# Patient Record
Sex: Male | Born: 1960 | Race: Black or African American | Hispanic: No | Marital: Single | Smoking: Current every day smoker
Health system: Southern US, Community
[De-identification: ages and names within clinical notes are randomized; demographics above are authoritative.]

## PROBLEM LIST (undated history)

## (undated) DIAGNOSIS — J45909 Unspecified asthma, uncomplicated: Secondary | ICD-10-CM

## (undated) HISTORY — PX: NO PAST SURGERIES: SHX2092

---

## 2010-04-03 ENCOUNTER — Emergency Department (HOSPITAL_COMMUNITY)
Admission: EM | Admit: 2010-04-03 | Discharge: 2010-04-04 | Payer: Self-pay | Source: Home / Self Care | Admitting: Emergency Medicine

## 2010-06-19 LAB — URINALYSIS, ROUTINE W REFLEX MICROSCOPIC
Bilirubin Urine: NEGATIVE
Ketones, ur: NEGATIVE mg/dL
Protein, ur: 30 mg/dL — AB
Specific Gravity, Urine: 1.022 (ref 1.005–1.030)

## 2010-06-19 LAB — URINE MICROSCOPIC-ADD ON

## 2013-07-15 ENCOUNTER — Emergency Department (HOSPITAL_COMMUNITY)
Admission: EM | Admit: 2013-07-15 | Discharge: 2013-07-15 | Disposition: A | Discharge status: Home / Self Care | Payer: Self-pay | Attending: Emergency Medicine | Admitting: Emergency Medicine

## 2013-07-15 ENCOUNTER — Encounter (HOSPITAL_COMMUNITY): Payer: Self-pay | Admitting: Emergency Medicine

## 2013-07-15 ENCOUNTER — Emergency Department (HOSPITAL_COMMUNITY): Payer: Self-pay

## 2013-07-15 DIAGNOSIS — J209 Acute bronchitis, unspecified: Secondary | ICD-10-CM

## 2013-07-15 DIAGNOSIS — F17200 Nicotine dependence, unspecified, uncomplicated: Secondary | ICD-10-CM | POA: Insufficient documentation

## 2013-07-15 DIAGNOSIS — M549 Dorsalgia, unspecified: Secondary | ICD-10-CM | POA: Insufficient documentation

## 2013-07-15 DIAGNOSIS — J45901 Unspecified asthma with (acute) exacerbation: Secondary | ICD-10-CM | POA: Insufficient documentation

## 2013-07-15 HISTORY — DX: Unspecified asthma, uncomplicated: J45.909

## 2013-07-15 MED ORDER — PREDNISONE 10 MG PO TABS: 20.0000 mg | ORAL_TABLET | Freq: Two times a day (BID) | ORAL | Status: DC

## 2013-07-15 MED ORDER — ALBUTEROL SULFATE HFA 108 (90 BASE) MCG/ACT IN AERS
2.0000 | INHALATION_SPRAY | RESPIRATORY_TRACT | Status: DC | PRN
  Administered 2013-07-15: 2 via RESPIRATORY_TRACT
  Filled 2013-07-15: qty 6.7

## 2013-07-15 MED ORDER — IPRATROPIUM BROMIDE 0.02 % IN SOLN
0.5000 mg | Freq: Once | RESPIRATORY_TRACT | Status: AC
  Administered 2013-07-15: 0.5 mg via RESPIRATORY_TRACT
  Filled 2013-07-15: qty 2.5

## 2013-07-15 MED ORDER — ALBUTEROL SULFATE (2.5 MG/3ML) 0.083% IN NEBU
5.0000 mg | INHALATION_SOLUTION | Freq: Once | RESPIRATORY_TRACT | Status: AC
  Administered 2013-07-15: 5 mg via RESPIRATORY_TRACT
  Filled 2013-07-15: qty 6

## 2013-07-15 MED ORDER — GUAIFENESIN-CODEINE 100-10 MG/5ML PO SOLN
10.0000 mL | Freq: Three times a day (TID) | ORAL | Status: DC | PRN
Start: 2013-07-15 — End: 2016-06-11

## 2013-07-15 NOTE — ED Provider Notes (Signed)
CSN: [id number]     Arrival date & time 07/15/13  1043 History  This chart was scribed for Veryl Speak, MD by Zettie Pho, ED Scribe. This patient was seen in room APA12/APA12 and the patient's care was started at 11:19 AM.    Chief Complaint  Patient presents with  . Cough  . Fever   The history is provided by the patient. No language interpreter was used.   HPI Comments: Thomas Mcdonald is a 53 y.o. Male with a history of asthma who presents to the Emergency Department complaining of a dry cough with associated congestion, chills, and fever (patient is afebrile at 99 in the ED) onset 5 days ago. Patient reports some associated wheezing at night and back pain secondary to the cough. He states that he has not had an asthma attack in several years and does not currently have an inhaler. Patient reports that he has been exposed to sick contacts with similar symptoms. He denies ear pain. Patient has no other pertinent medical history. Patient is a current, every day smoker.   Past Medical History  Diagnosis Date  . Asthma    History reviewed. No pertinent past surgical history. No family history on file. History  Substance Use Topics  . Smoking status: Current Every Day Smoker    Types: Cigarettes  . Smokeless tobacco: Not on file  . Alcohol Use: Yes     Comment: Occ    Review of Systems  A complete 10 system review of systems was obtained and all systems are negative except as noted in the HPI and PMH.    Allergies  Review of patient's allergies indicates no known allergies.  Home Medications   Current Outpatient Rx  Name  Route  Sig  Dispense  Refill  . acetaminophen (TYLENOL) 500 MG tablet   Oral   Take 1,000 mg by mouth every 6 (six) hours as needed for fever.         Marland Kitchen ibuprofen (ADVIL,MOTRIN) 200 MG tablet   Oral   Take 200 mg by mouth every 6 (six) hours as needed for fever.         . Multiple Vitamin (MULTIVITAMIN WITH MINERALS) TABS tablet   Oral   Take 1  tablet by mouth daily.          Triage Vitals: BP 124/67  Pulse 87  Temp(Src) 99 F (37.2 C) (Oral)  Resp 20  Ht 5\' 9"  (1.753 m)  Wt 170 lb (77.111 kg)  BMI 25.09 kg/m2  SpO2 97%  Physical Exam  Nursing note and vitals reviewed. Constitutional: He is oriented to person, place, and time. He appears well-developed and well-nourished. No distress.  HENT:  Head: Normocephalic and atraumatic.  Right Ear: Hearing, tympanic membrane, external ear and ear canal normal.  Left Ear: Hearing, tympanic membrane, external ear and ear canal normal.  Mouth/Throat: Oropharynx is clear and moist. No oropharyngeal exudate.  No frontal or maxillary sinus tenderness to palpation.   Eyes: Conjunctivae are normal.  Neck: Normal range of motion. Neck supple.  Cardiovascular: Normal rate, regular rhythm and normal heart sounds.   Pulmonary/Chest: Effort normal and breath sounds normal. No respiratory distress.  Abdominal: He exhibits no distension.  Musculoskeletal: Normal range of motion.  Lymphadenopathy:    He has no cervical adenopathy.  Neurological: He is alert and oriented to person, place, and time.  Skin: Skin is warm and dry.  Psychiatric: He has a normal mood and affect. His behavior is normal.  ED Course  Procedures (including critical care time)  DIAGNOSTIC STUDIES: Oxygen Saturation is 97% on room air, normal by my interpretation.    COORDINATION OF CARE: 11:03 AM- Ordered a chest x-ray.   11:22 AM- Will order a breathing treatment to manage symptoms. Discussed treatment plan with patient at bedside and patient verbalized agreement.   12:19 PM- Discussed that x-ray results were negative and that symptoms are likely viral in nature. Patient reports feeling much better after receiving the breathing treatment. Will discharge patient with an inhaler, prednisone, and a cough suppressant to manage symptoms. Discussed treatment plan with patient at bedside and patient verbalized  agreement.    Labs Review Labs Reviewed - No data to display  Imaging Review Dg Chest 2 View  07/15/2013   CLINICAL DATA:  Cough and fever.  EXAM: CHEST  2 VIEW  COMPARISON:  None.  FINDINGS: Lungs clear. Heart size normal. No pneumothorax or pleural effusion. Remote lower left rib fracture and thoracic spondylosis noted.  IMPRESSION: No acute disease.   Electronically Signed   By: Inge Rise M.D.   On: 07/15/2013 11:34     EKG Interpretation None      MDM   Final diagnoses:  None    Patient presents with chest congestion and cough. He has a history of asthma and continues to smoke. Lung exam is unremarkable and chest x-ray does not reveal an infiltrate or other acute finding. His symptoms have been present for 5 days and I suspect are viral in nature. He will be prescribed prednisone, and albuterol inhaler, and cocci are to be used as needed.  I personally performed the services described in this documentation, which was scribed in my presence. The recorded information has been reviewed and is accurate.       Veryl Speak, MD 07/15/13 1228

## 2013-07-15 NOTE — ED Notes (Signed)
Pt states mostly nonproductive cough and fever which began Saturday. Denies pain at this time.

## 2013-07-15 NOTE — Discharge Instructions (Signed)
Prednisone as prescribed.  Albuterol inhaler: 2 puffs every 4 hours as needed for wheezing.  Robitussin with codeine as needed for cough.  Return to the ER if you develop severe chest pain, worsening breathing, or high fever.   Acute Bronchitis Bronchitis is inflammation of the airways that extend from the windpipe into the lungs (bronchi). The inflammation often causes mucus to develop. This leads to a cough, which is the most common symptom of bronchitis.  In acute bronchitis, the condition usually develops suddenly and goes away over time, usually in a couple weeks. Smoking, allergies, and asthma can make bronchitis worse. Repeated episodes of bronchitis may cause further lung problems.  CAUSES Acute bronchitis is most often caused by the same virus that causes a cold. The virus can spread from person to person (contagious).  SIGNS AND SYMPTOMS   Cough.   Fever.   Coughing up mucus.   Body aches.   Chest congestion.   Chills.   Shortness of breath.   Sore throat.  DIAGNOSIS  Acute bronchitis is usually diagnosed through a physical exam. Tests, such as chest X-rays, are sometimes done to rule out other conditions.  TREATMENT  Acute bronchitis usually goes away in a couple weeks. Often times, no medical treatment is necessary. Medicines are sometimes given for relief of fever or cough. Antibiotics are usually not needed but may be prescribed in certain situations. In some cases, an inhaler may be recommended to help reduce shortness of breath and control the cough. A cool mist vaporizer may also be used to help thin bronchial secretions and make it easier to clear the chest.  HOME CARE INSTRUCTIONS  Get plenty of rest.   Drink enough fluids to keep your urine clear or pale yellow (unless you have a medical condition that requires fluid restriction). Increasing fluids may help thin your secretions and will prevent dehydration.   Only take over-the-counter or  prescription medicines as directed by your health care provider.   Avoid smoking and secondhand smoke. Exposure to cigarette smoke or irritating chemicals will make bronchitis worse. If you are a smoker, consider using nicotine gum or skin patches to help control withdrawal symptoms. Quitting smoking will help your lungs heal faster.   Reduce the chances of another bout of acute bronchitis by washing your hands frequently, avoiding people with cold symptoms, and trying not to touch your hands to your mouth, nose, or eyes.   Follow up with your health care provider as directed.  SEEK MEDICAL CARE IF: Your symptoms do not improve after 1 week of treatment.  SEEK IMMEDIATE MEDICAL CARE IF:  You develop an increased fever or chills.   You have chest pain.   You have severe shortness of breath.  You have bloody sputum.   You develop dehydration.  You develop fainting.  You develop repeated vomiting.  You develop a severe headache. MAKE SURE YOU:   Understand these instructions.  Will watch your condition.  Will get help right away if you are not doing well or get worse. Document Released: 05/03/2004 Document Revised: 11/26/2012 Document Reviewed: 09/16/2012 [facility] Patient Information 2014 [place].

## 2016-06-11 ENCOUNTER — Encounter: Payer: Self-pay | Admitting: Family Medicine

## 2016-06-11 ENCOUNTER — Ambulatory Visit (INDEPENDENT_AMBULATORY_CARE_PROVIDER_SITE_OTHER): Payer: 59 | Admitting: Family Medicine

## 2016-06-11 VITALS — BP 102/64 | HR 80 | Temp 98.6°F | Resp 14 | Ht 69.0 in | Wt 177.0 lb

## 2016-06-11 DIAGNOSIS — Z125 Encounter for screening for malignant neoplasm of prostate: Secondary | ICD-10-CM

## 2016-06-11 DIAGNOSIS — R6882 Decreased libido: Secondary | ICD-10-CM | POA: Diagnosis not present

## 2016-06-11 DIAGNOSIS — Z23 Encounter for immunization: Secondary | ICD-10-CM

## 2016-06-11 DIAGNOSIS — Z1211 Encounter for screening for malignant neoplasm of colon: Secondary | ICD-10-CM | POA: Diagnosis not present

## 2016-06-11 DIAGNOSIS — Z1159 Encounter for screening for other viral diseases: Secondary | ICD-10-CM

## 2016-06-11 DIAGNOSIS — Z0000 Encounter for general adult medical examination without abnormal findings: Secondary | ICD-10-CM

## 2016-06-11 LAB — CBC WITH DIFFERENTIAL/PLATELET
BASOS PCT: 1 %
Basophils Absolute: 73 cells/uL (ref 0–200)
EOS ABS: 657 {cells}/uL — AB (ref 15–500)
Eosinophils Relative: 9 %
HEMATOCRIT: 43 % (ref 38.5–50.0)
Hemoglobin: 14.5 g/dL (ref 13.0–17.0)
Lymphocytes Relative: 26 %
Lymphs Abs: 1898 cells/uL (ref 850–3900)
MCH: 30.7 pg (ref 27.0–33.0)
MCHC: 33.7 g/dL (ref 32.0–36.0)
MCV: 91.1 fL (ref 80.0–100.0)
MONO ABS: 584 {cells}/uL (ref 200–950)
MONOS PCT: 8 %
MPV: 9.7 fL (ref 7.5–12.5)
NEUTROS ABS: 4088 {cells}/uL (ref 1500–7800)
Neutrophils Relative %: 56 %
PLATELETS: 267 10*3/uL (ref 140–400)
RBC: 4.72 MIL/uL (ref 4.20–5.80)
RDW: 13.8 % (ref 11.0–15.0)
WBC: 7.3 10*3/uL (ref 3.8–10.8)

## 2016-06-11 LAB — COMPREHENSIVE METABOLIC PANEL
ALK PHOS: 85 U/L (ref 40–115)
ALT: 19 U/L (ref 9–46)
AST: 19 U/L (ref 10–35)
Albumin: 4.1 g/dL (ref 3.6–5.1)
BILIRUBIN TOTAL: 0.4 mg/dL (ref 0.2–1.2)
BUN: 16 mg/dL (ref 7–25)
CO2: 29 mmol/L (ref 20–31)
Calcium: 9.2 mg/dL (ref 8.6–10.3)
Chloride: 103 mmol/L (ref 98–110)
Creat: 0.96 mg/dL (ref 0.70–1.33)
Glucose, Bld: 76 mg/dL (ref 70–99)
POTASSIUM: 4.5 mmol/L (ref 3.5–5.3)
Sodium: 140 mmol/L (ref 135–146)
TOTAL PROTEIN: 6.3 g/dL (ref 6.1–8.1)

## 2016-06-11 LAB — PSA: PSA: 1.2 ng/mL (ref <=4.0)

## 2016-06-11 LAB — LIPID PANEL
CHOL/HDL RATIO: 2.9 ratio (ref <5.0)
CHOLESTEROL: 185 mg/dL (ref <200)
HDL: 64 mg/dL (ref >40)
LDL Cholesterol: 105 mg/dL — ABNORMAL HIGH (ref <100)
Triglycerides: 82 mg/dL (ref <150)
VLDL: 16 mg/dL (ref <30)

## 2016-06-11 LAB — TSH: TSH: 2.16 m[IU]/L (ref 0.40–4.50)

## 2016-06-11 NOTE — Patient Instructions (Signed)
Release of records- [facility]  I recommend eye visit once a year I recommend dental visit every 6 months Goal is to  Exercise 30 minutes 5 days a week We will send a letter with lab results  Referral for colonoscopy  F/u pending results

## 2016-06-11 NOTE — Progress Notes (Signed)
   Subjective:    Patient ID: Thomas Mcdonald, male    DOB: 21-Apr-1960, 56 y.o.   MRN: [id number]  Patient presents for New Patient~ Establish Care (is fasting)  Pt here to establish care, previous PCP  Works for [employer] in [place], [place]  Asthma as a child has not needed anything in years Quit smoking 1.63months ago   Low libido, has used viagra in the past, no pre-ejaculation, difficulty getting and maintaining erections  At end of visit states he gets back pain on and off, for the past year or so, gets pain that radiates red labs     Review Of Systems:  GEN- denies fatigue, fever, weight loss,weakness, recent illness HEENT- denies eye drainage, change in vision, nasal discharge, CVS- denies chest pain, palpitations RESP- denies SOB, cough, wheeze ABD- denies N/V, change in stools, abd pain GU- denies dysuria, hematuria, dribbling, incontinence MSK- denies joint pain, muscle aches, injury Neuro- denies headache, dizziness, syncope, seizure activity       Objective:    BP 102/64   Pulse 80   Temp 98.6 F (37 C) (Oral)   Resp 14   Ht 5\' 9"  (1.753 m)   Wt 177 lb (80.3 kg)   SpO2 99%   BMI 26.14 kg/m  GEN- NAD, alert and oriented x3 HEENT- PERRL, EOMI, non injected sclera, pink conjunctiva, MMM, oropharynx clear Neck- Supple, no thyromegaly CVS- RRR, no murmur RESP-CTAB ABD-NABS,soft,NT,ND Rectum- normal tone, FOBT neg, prostate smooth, mild enlargement Psych- normal affect and mood  EXT- No edema Pulses- Radial, DP- 2+        Assessment & Plan:      Problem List Items Addressed This Visit    None    Visit Diagnoses    Routine general medical examination at a health care facility    -  Primary   CPE done, fasting labs, check Hep C, tesosterone PSA for low libido. given viagra samples 50mg  #4 . Referral for colonoscopy   Relevant Orders   CBC with Differential/Platelet (Completed)   Comprehensive metabolic panel (Completed)   Lipid panel (Completed)   TSH (Completed)   HIV antibody (Completed)   Prostate cancer screening       Relevant Orders   PSA (Completed)   Encounter for hepatitis C screening test for low risk patient       Relevant Orders   Hepatitis C antibody (Completed)   Colon cancer screening       Low libido       Relevant Orders   Testosterone (Completed)      Note: This dictation was prepared with Dragon dictation along with smaller phrase technology. Any transcriptional errors that result from this process are unintentional.

## 2016-06-12 LAB — HEPATITIS C ANTIBODY: HCV AB: NEGATIVE

## 2016-06-12 LAB — HIV ANTIBODY (ROUTINE TESTING W REFLEX): HIV: NONREACTIVE

## 2016-06-12 LAB — TESTOSTERONE: TESTOSTERONE: 479 ng/dL (ref 250–827)

## 2016-06-15 ENCOUNTER — Other Ambulatory Visit: Payer: Self-pay | Admitting: *Deleted

## 2016-06-15 DIAGNOSIS — R52 Pain, unspecified: Secondary | ICD-10-CM

## 2016-07-17 ENCOUNTER — Encounter: Payer: Self-pay | Admitting: Family Medicine

## 2016-09-13 ENCOUNTER — Encounter: Payer: Self-pay | Admitting: Family Medicine

## 2016-09-13 ENCOUNTER — Ambulatory Visit (INDEPENDENT_AMBULATORY_CARE_PROVIDER_SITE_OTHER): Payer: 59 | Admitting: Family Medicine

## 2016-09-13 VITALS — BP 110/74 | HR 60 | Temp 97.9°F | Resp 16 | Ht 69.0 in | Wt 182.0 lb

## 2016-09-13 DIAGNOSIS — W57XXXA Bitten or stung by nonvenomous insect and other nonvenomous arthropods, initial encounter: Principal | ICD-10-CM

## 2016-09-13 DIAGNOSIS — L03114 Cellulitis of left upper limb: Secondary | ICD-10-CM

## 2016-09-13 DIAGNOSIS — S40862A Insect bite (nonvenomous) of left upper arm, initial encounter: Secondary | ICD-10-CM | POA: Diagnosis not present

## 2016-09-13 MED ORDER — DOXYCYCLINE HYCLATE 100 MG PO TABS: 100.0000 mg | ORAL_TABLET | Freq: Two times a day (BID) | ORAL | 0 refills | Status: DC

## 2016-09-13 NOTE — Progress Notes (Signed)
   Subjective:    Patient ID: Thomas Mcdonald, male    DOB: 1960-07-30, 57 y.o.   MRN: [id number]  HPI  Patient felt a stinging pain on the dorsum of his left wrist yesterday. He was being bitten by an insect. He slapped the left wrist and saw the remains of some type of insect that was completely smashed and unrecognizable. Now all the dorsum of the left wrist is swollen substantially with warmth and erythema and swelling spreading up the left forearm and down into the left hand and into the left thumb. Differential diagnosis includes cellulitis versus allergic reaction. Past Medical History:  Diagnosis Date  . Asthma    No past surgical history on file. Current Outpatient Prescriptions on File Prior to Visit  Medication Sig Dispense Refill  . Multiple Vitamin (MULTIVITAMIN WITH MINERALS) TABS tablet Take 1 tablet by mouth daily.     No current facility-administered medications on file prior to visit.    No Known Allergies Social History   Social History  . Marital status: Single    Spouse name: N/A  . Number of children: N/A  . Years of education: N/A   Occupational History  . Not on file.   Social History Main Topics  . Smoking status: Former Smoker    Types: Cigarettes    Quit date: 05/14/2016  . Smokeless tobacco: Never Used  . Alcohol use Yes     Comment: Occ  . Drug use: No  . Sexual activity: Not Currently   Other Topics Concern  . Not on file   Social History Narrative  . No narrative on file     Review of Systems  All other systems reviewed and are negative.      Objective:   Physical Exam  Constitutional: He appears well-developed and well-nourished.  Cardiovascular: Normal rate, regular rhythm and normal heart sounds.   Pulmonary/Chest: Effort normal and breath sounds normal. No respiratory distress. He has no wheezes. He has no rales.  Musculoskeletal: He exhibits edema.       Arms: Skin: Skin is warm. There is erythema.            Assessment & Plan:  Insect bite of left arm, initial encounter  Cellulitis of left upper extremity  This could be an allergic reaction but I'm concerned about cellulitis. His tetanus shot was less than 2 years ago according to the patient. Begin doxycycline 100 mg by mouth twice a day for 7-10 days. Cover for possible allergic reaction with Benadryl 25 mg every 6 hours. Recheck in 24 hours. Consider prednisone if not improving.

## 2017-02-07 ENCOUNTER — Encounter: Payer: Self-pay | Admitting: Family Medicine

## 2018-01-16 ENCOUNTER — Encounter: Payer: Self-pay | Admitting: Family Medicine

## 2018-01-16 ENCOUNTER — Other Ambulatory Visit: Payer: Self-pay

## 2018-01-16 ENCOUNTER — Ambulatory Visit (INDEPENDENT_AMBULATORY_CARE_PROVIDER_SITE_OTHER): Payer: BLUE CROSS/BLUE SHIELD | Admitting: Family Medicine

## 2018-01-16 VITALS — BP 126/80 | HR 82 | Temp 98.1°F | Resp 14 | Ht 69.0 in | Wt 178.0 lb

## 2018-01-16 DIAGNOSIS — R200 Anesthesia of skin: Secondary | ICD-10-CM | POA: Diagnosis not present

## 2018-01-16 DIAGNOSIS — M5417 Radiculopathy, lumbosacral region: Secondary | ICD-10-CM

## 2018-01-16 DIAGNOSIS — M5442 Lumbago with sciatica, left side: Secondary | ICD-10-CM | POA: Diagnosis not present

## 2018-01-16 MED ORDER — CYCLOBENZAPRINE HCL 10 MG PO TABS: 10.0000 mg | ORAL_TABLET | Freq: Three times a day (TID) | ORAL | 0 refills | Status: DC | PRN

## 2018-01-16 MED ORDER — PREDNISONE 20 MG PO TABS: ORAL_TABLET | ORAL | 0 refills | Status: DC

## 2018-01-16 NOTE — Progress Notes (Signed)
Patient ID: Thomas Mcdonald, male    DOB: 08/15/1960, 57 y.o.   MRN: [id number]  PCP: Alycia Rossetti, MD  Chief Complaint  Patient presents with  . L sided sciatic pain    x months- pain in L side of back radiating down to L foot-numbness in lower leg/ foot  . R shoulder/ arm pain    states that he has pain in shoulder and arm in AM    Subjective:   Thomas Mcdonald is a 57 y.o. male, presents to clinic with CC of several months to a year of intermittent and gradually worsening left low back pain with associated several months of persistent left foot numbness.  Left low back pain located also in his buttocks with some radiation down his leg currently it is radiating to the back of his thigh will and is often associated with pins-and-needles sensation numbness and tingling is painful, generally his pain is described as some burning shooting pain but also has felt as a fairly constant ache or dull pain as well.  Pain usually gets exacerbated with sitting for long periods of time, pain worsens and numbness worsens, back pain worsens with strenuous activity lifting and bending over at work.  Only feels better when lying down flat at night.  He has occasionally had associated severe muscle spasms or charley horses in his left leg.  He denies any weakness to his leg any change to his appearance of muscle scan or hair, he denies any pallor or redness.  Denies any saddle anesthesia, incontinence of stool or urine, abdominal pain, flank pain or urinary symptoms.  He also had a several days where he is woken up in his entire right arm feels slightly numb and tingling but it gets better after he is awake and up and moving, he states it may be the way that he has been laying on it.  He denies any injury to his shoulder.  He also denies any change to the appearance of his skin or coloring of his arm.  He is only tried Aleve 500 mg to treat his symptoms, no noticeable improvement.  There are no  active problems to display for this patient.    Prior to Admission medications   Medication Sig Start Date End Date Taking? Authorizing Provider  Multiple Vitamin (MULTIVITAMIN WITH MINERALS) TABS tablet Take 1 tablet by mouth daily.   Yes [provider]     No Known Allergies   Family History  Problem Relation Age of Onset  . Cancer Mother   . Heart disease Mother   . Alcohol abuse Father   . Heart disease Father     Review of Systems  Constitutional: Negative.  Negative for activity change, appetite change, chills, diaphoresis, fatigue, fever and unexpected weight change.  HENT: Negative.   Eyes: Negative.   Respiratory: Negative.   Cardiovascular: Negative.   Gastrointestinal: Negative.   Endocrine: Negative.   Genitourinary: Negative.  Negative for difficulty urinating.  Musculoskeletal: Negative.  Negative for gait problem and joint swelling.  Skin: Negative.  Negative for color change.  Allergic/Immunologic: Negative.   Neurological: Positive for numbness. Negative for tremors, weakness and light-headedness.  Hematological: Negative.   Psychiatric/Behavioral: Negative.   All other systems reviewed and are negative.      Objective:    Vitals:   01/16/18 1553  BP: 126/80  Pulse: 82  Resp: 14  Temp: 98.1 F (36.7 C)  TempSrc: Oral  SpO2: 96%  Weight: 178  lb (80.7 kg)  Height: 5\' 9"  (1.753 m)     Physical Exam  Constitutional: He appears well-developed and well-nourished.  Non-toxic appearance. He does not appear ill. No distress.  HENT:  Head: Normocephalic and atraumatic.  Right Ear: Tympanic membrane, external ear and ear canal normal.  Left Ear: Tympanic membrane, external ear and ear canal normal.  Nose: Nose normal. No mucosal edema or rhinorrhea. Right sinus exhibits no maxillary sinus tenderness and no frontal sinus tenderness. Left sinus exhibits no maxillary sinus tenderness and no frontal sinus tenderness.  Mouth/Throat: Uvula is  midline. No trismus in the jaw. No uvula swelling. No posterior oropharyngeal edema or posterior oropharyngeal erythema.  Eyes: Pupils are equal, round, and reactive to light. Conjunctivae, EOM and lids are normal. Right eye exhibits no discharge. Left eye exhibits no discharge.  Neck: Trachea normal, normal range of motion and phonation normal. Neck supple. No tracheal deviation present.  Cardiovascular: Normal rate, regular rhythm, normal heart sounds, intact distal pulses and normal pulses. Exam reveals no gallop and no friction rub.  No murmur heard. Pulses:      Radial pulses are 2+ on the right side, and 2+ on the left side.       Posterior tibial pulses are 2+ on the right side, and 2+ on the left side.  Pulmonary/Chest: Effort normal and breath sounds normal. No stridor. No respiratory distress. He has no wheezes. He has no rhonchi. He has no rales.  Abdominal: Soft. Normal appearance and bowel sounds are normal. He exhibits no distension. There is no CVA tenderness.  Musculoskeletal: Normal range of motion. He exhibits tenderness. He exhibits no edema or deformity.       Right shoulder: Normal.       Right elbow: Normal.      Cervical back: He exhibits normal range of motion, no tenderness and no bony tenderness.       Thoracic back: He exhibits normal range of motion, no tenderness and no bony tenderness.       Lumbar back: He exhibits tenderness and spasm. He exhibits normal range of motion, no bony tenderness, no swelling, no edema, no deformity, no pain and normal pulse.       Back:  No midline tenderness from cervical to lumbar and midline sacral spine but there is left SI point tenderness and left paraspinal muscle tenderness to palpation Range of motion of back and is easily able to rotate to the right and the left at the waist and flex forward and extend back without difficulty, he has normal gait  Neurological: He is alert. He has normal strength. He displays no atrophy and no  tremor. A sensory deficit is present. He exhibits normal muscle tone. Coordination and gait normal.  Decreased sensation to light touch to left lower extremity, normal to right LE Decreased sharp/dull sensation to left lower leg L5-S1 distribution lateral and posterior lower leg Normal sensation to sharp/dull to right leg 5/5 strength with dorsiflexion and plantarflexion bilaterally  Skin: Skin is warm, dry and intact. Capillary refill takes less than 2 seconds. No rash noted. He is not diaphoretic. No cyanosis. No pallor. Nails show no clubbing.  Psychiatric: He has a normal mood and affect. His speech is normal and behavior is normal.  Nursing note and vitals reviewed.         Assessment & Plan:      ICD-10-CM   1. Acute left-sided low back pain with left-sided sciatica M54.42 DG Lumbar Spine Complete  predniSONE (DELTASONE) 20 MG tablet    cyclobenzaprine (FLEXERIL) 10 MG tablet    Ambulatory referral to Physical Therapy  2. Lumbosacral radiculopathy M54.17   3. Left leg numbness R20.0     Concern for some lumbar spinal radiculopathy or possible spinal stenosis causing numbness along the L5-S1 nerve distribution.  Patient has no weakness but he has appreciable numbness on exam.  He has normal range of motion and strength but does have some tenderness to left lumbar paraspinal muscles and SI joint, will start treatment for some sciatica/radiculopathy.  Also encouraged him to use topical heating patches, heating pads and frequent position changing.  I feel like he will benefit from physical therapy.  He has not had any plain films of his back however with the decreased sensation do feel like an MRI is indicated, we will try to obtain approval and schedule this for him.  Seems like this is been a very gradual progression of years back pain and several months of numbness, no acute injury, no red flags concerning for cauda equina.   Delsa Grana, PA-C 01/16/18 3:58 PM

## 2018-01-16 NOTE — Patient Instructions (Addendum)
Sciatica Sciatica is pain, numbness, weakness, or tingling along your sciatic nerve. The sciatic nerve starts in the lower back and goes down the back of each leg. Sciatica happens when this nerve is pinched or has pressure put on it. Sciatica usually goes away on its own or with treatment. Sometimes, sciatica may keep coming back (recur). Follow these instructions at home: Medicines  Take over-the-counter and prescription medicines only as told by your doctor.  Do not drive or use heavy machinery while taking prescription pain medicine. Managing pain  If directed, put ice on the affected area. ? Put ice in a plastic bag. ? Place a towel between your skin and the bag. ? Leave the ice on for 20 minutes, 2-3 times a day.  After icing, apply heat to the affected area before you exercise or as often as told by your doctor. Use the heat source that your doctor tells you to use, such as a moist heat pack or a heating pad. ? Place a towel between your skin and the heat source. ? Leave the heat on for 20-30 minutes. ? Remove the heat if your skin turns bright red. This is especially important if you are unable to feel pain, heat, or cold. You may have a greater risk of getting burned. Activity  Return to your normal activities as told by your doctor. Ask your doctor what activities are safe for you. ? Avoid activities that make your sciatica worse.  Take short rests during the day. Rest in a lying or standing position. This is usually better than sitting to rest. ? When you rest for a long time, do some physical activity or stretching between periods of rest. ? Avoid sitting for a long time without moving. Get up and move around at least one time each hour.  Exercise and stretch regularly, as told by your doctor.  Do not lift anything that is heavier than 10 lb (4.5 kg) while you have symptoms of sciatica. ? Avoid lifting heavy things even when you do not have symptoms. ? Avoid lifting heavy  things over and over.  When you lift objects, always lift in a way that is safe for your body. To do this, you should: ? Bend your knees. ? Keep the object close to your body. ? Avoid twisting. General instructions  Use good posture. ? Avoid leaning forward when you are sitting. ? Avoid hunching over when you are standing.  Stay at a healthy weight.  Wear comfortable shoes that support your feet. Avoid wearing high heels.  Avoid sleeping on a mattress that is too soft or too hard. You might have less pain if you sleep on a mattress that is firm enough to support your back.  Keep all follow-up visits as told by your doctor. This is important. Contact a doctor if:  You have pain that: ? Wakes you up when you are sleeping. ? Gets worse when you lie down. ? Is worse than the pain you have had in the past. ? Lasts longer than 4 weeks.  You lose weight for without trying. Get help right away if:  You cannot control when you pee (urinate) or poop (have a bowel movement).  You have weakness in any of these areas and it gets worse. ? Lower back. ? Lower belly (pelvis). ? Butt (buttocks). ? Legs.  You have redness or swelling of your back.  You have a burning feeling when you pee. This information is not intended to replace   advice given to you by your health care provider. Make sure you discuss any questions you have with your health care provider. Document Released: 01/03/2008 Document Revised: 09/01/2015 Document Reviewed: 12/03/2014 Elsevier Interactive Patient Education  2018 [employer].   Shoulder Pain Many things can cause shoulder pain, including:  An injury.  Moving the arm in the same way again and again (overuse).  Joint pain (arthritis).  Follow these instructions at home: Take these actions to help with your pain:  Squeeze a soft ball or a foam pad as much as you can. This helps to prevent swelling. It also makes the arm stronger.  Take  over-the-counter and prescription medicines only as told by your doctor.  If told, put ice on the area: ? Put ice in a plastic bag. ? Place a towel between your skin and the bag. ? Leave the ice on for 20 minutes, 2-3 times per day. Stop putting on ice if it does not help with the pain.  If you were given a shoulder sling or immobilizer: ? Wear it as told. ? Remove it to shower or bathe. ? Move your arm as little as possible. ? Keep your hand moving. This helps prevent swelling.  Contact a doctor if:  Your pain gets worse.  Medicine does not help your pain.  You have new pain in your arm, hand, or fingers. Get help right away if:  Your arm, hand, or fingers: ? Tingle. ? Are numb. ? Are swollen. ? Are painful. ? Turn white or blue. This information is not intended to replace advice given to you by your health care provider. Make sure you discuss any questions you have with your health care provider. Document Released: 09/12/2007 Document Revised: 11/20/2015 Document Reviewed: 07/19/2014 Elsevier Interactive Patient Education  [employer].

## 2018-02-07 ENCOUNTER — Ambulatory Visit (INDEPENDENT_AMBULATORY_CARE_PROVIDER_SITE_OTHER): Payer: BLUE CROSS/BLUE SHIELD

## 2018-02-07 DIAGNOSIS — Z23 Encounter for immunization: Secondary | ICD-10-CM | POA: Diagnosis not present

## 2018-02-07 NOTE — Progress Notes (Signed)
Patient came in today to receive annual flu vaccine. Patient was given Fluarix in the left deltoid. He tolerated well. VIS given.

## 2018-02-13 ENCOUNTER — Ambulatory Visit: Payer: Self-pay | Admitting: Physical Therapy

## 2018-04-23 ENCOUNTER — Encounter: Payer: Self-pay | Admitting: Family Medicine

## 2018-04-23 ENCOUNTER — Other Ambulatory Visit: Payer: Self-pay

## 2018-04-23 ENCOUNTER — Ambulatory Visit
Admission: RE | Admit: 2018-04-23 | Discharge: 2018-04-23 | Disposition: A | Discharge status: Home / Self Care | Payer: BLUE CROSS/BLUE SHIELD | Source: Ambulatory Visit | Attending: Family Medicine | Admitting: Family Medicine

## 2018-04-23 ENCOUNTER — Ambulatory Visit (INDEPENDENT_AMBULATORY_CARE_PROVIDER_SITE_OTHER): Payer: BLUE CROSS/BLUE SHIELD | Admitting: Family Medicine

## 2018-04-23 VITALS — BP 120/74 | HR 88 | Temp 97.9°F | Resp 14 | Ht 69.0 in | Wt 181.0 lb

## 2018-04-23 DIAGNOSIS — R202 Paresthesia of skin: Secondary | ICD-10-CM | POA: Insufficient documentation

## 2018-04-23 DIAGNOSIS — M25561 Pain in right knee: Principal | ICD-10-CM

## 2018-04-23 DIAGNOSIS — M5410 Radiculopathy, site unspecified: Secondary | ICD-10-CM | POA: Diagnosis not present

## 2018-04-23 DIAGNOSIS — G8929 Other chronic pain: Secondary | ICD-10-CM | POA: Diagnosis not present

## 2018-04-23 DIAGNOSIS — R200 Anesthesia of skin: Secondary | ICD-10-CM | POA: Insufficient documentation

## 2018-04-23 DIAGNOSIS — M545 Low back pain: Secondary | ICD-10-CM | POA: Diagnosis not present

## 2018-04-23 DIAGNOSIS — M5137 Other intervertebral disc degeneration, lumbosacral region: Secondary | ICD-10-CM | POA: Diagnosis not present

## 2018-04-23 DIAGNOSIS — D1621 Benign neoplasm of long bones of right lower limb: Secondary | ICD-10-CM

## 2018-04-23 DIAGNOSIS — M5136 Other intervertebral disc degeneration, lumbar region: Secondary | ICD-10-CM

## 2018-04-23 MED ORDER — MELOXICAM 7.5 MG PO TABS: 7.5000 mg | ORAL_TABLET | Freq: Every day | ORAL | 1 refills | Status: DC

## 2018-04-23 NOTE — Progress Notes (Signed)
   Subjective:    Patient ID: Thomas Mcdonald, male    DOB: 09/23/1960, 58 y.o.   MRN: [id number]  Patient presents for R Knee Pain (x weeks- states that he had hutch drop on knee) and L Leg Numbness (x months- states that he has sciatica and would like to know what else he can do)  Pt here with left leg numbness, fels like he has to drag his leg on occasion. Gets tingling and pain,starts from the buttocks,has done home PT stretching no improvement, no current back pain just numb leg and weakness. 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 He has had episodes of swelling of his knee as well.   Taking naprosyn as needed         Review Of Systems:  GEN- denies fatigue, fever, weight loss,weakness, recent illness HEENT- denies eye drainage, change in vision, nasal discharge, CVS- denies chest pain, palpitations RESP- denies SOB, cough, wheeze ABD- denies N/V, change in stools, abd pain GU- denies dysuria, hematuria, dribbling, incontinence MSK- +joint pain, muscle aches, injury Neuro- denies headache, dizziness, syncope, seizure activity       Objective:    BP 120/74   Pulse 88   Temp 97.9 F (36.6 C) (Oral)   Resp 14   Ht 5\' 9"  (1.753 m)   Wt 181 lb (82.1 kg)   SpO2 95%   BMI 26.73 kg/m  GEN- NAD, alert and oriented x3 CVS- RRR, no murmur RESP-CTAB MSK- Bilat knee, no effusion, decreased ROM right knee, mild crepitus, TTP ant and medial aspect of knee, ligaments grossly in tact Spine NT, + SLR left side,  Neuro- decreased strength LLE compared to right, DTR a little hyper-reflexic on left side, decreased monofilament and gross sensation Left  foot lateral aspect to heel and leg compared to right  EXT- No edema Pulses- Radial, DP- 2+        Assessment & Plan:      Problem List Items Addressed This Visit    None    Visit Diagnoses    Chronic pain of right knee    -  Primary   Obtain xray of knee, chronic pain expect DJD of knee, start mobic plan ortho   Relevant Orders   DG Knee Complete 4 Views Right (Completed)   Back pain with left-sided radiculopathy       Concern about his radicular symptoms and the weakness and numbness, concern for spinal stenosis or severe nerve impingment now causing weakness. Obtain Xray to evaluate bony pathology, needs ,MRI to evaluate source of nerve compression/damage   Relevant Medications   meloxicam (MOBIC) 7.5 MG tablet   Other Relevant Orders   DG Lumbar Spine Complete (Completed)   Numbness and tingling of left leg       Relevant Orders   DG Lumbar Spine Complete (Completed)      Note: This dictation was prepared with Dragon dictation along with smaller phrase technology. Any transcriptional errors that result from this process are unintentional.

## 2018-04-23 NOTE — Patient Instructions (Addendum)
Get xray of right knee and lumbar spine MRI is being set up  Start meloxicam once a day  Give doctors note for today  F/U schedule physical

## 2018-04-24 ENCOUNTER — Telehealth: Payer: Self-pay | Admitting: *Deleted

## 2018-04-24 ENCOUNTER — Encounter: Payer: Self-pay | Admitting: Family Medicine

## 2018-04-24 NOTE — Telephone Encounter (Signed)
Received call from [facility] with call reports on X-ray of Knee.   Noted oval-shaped lucency seen in anterior portion of distal right femoral shaft concerning for possible neoplasm. MRI with and without gadolinium is recommended for further evaluation.  MD to be made aware.

## 2018-04-24 NOTE — Addendum Note (Signed)
Addended by: Vic Blackbird F on: 04/24/2018 10:33 AM   Modules accepted: Orders

## 2018-05-01 ENCOUNTER — Ambulatory Visit
Admission: RE | Admit: 2018-05-01 | Discharge: 2018-05-01 | Disposition: A | Discharge status: Home / Self Care | Payer: BLUE CROSS/BLUE SHIELD | Source: Ambulatory Visit | Attending: Family Medicine | Admitting: Family Medicine

## 2018-05-01 DIAGNOSIS — D1621 Benign neoplasm of long bones of right lower limb: Secondary | ICD-10-CM

## 2018-05-01 DIAGNOSIS — M48061 Spinal stenosis, lumbar region without neurogenic claudication: Secondary | ICD-10-CM | POA: Diagnosis not present

## 2018-05-01 DIAGNOSIS — R202 Paresthesia of skin: Secondary | ICD-10-CM

## 2018-05-01 DIAGNOSIS — M5137 Other intervertebral disc degeneration, lumbosacral region: Secondary | ICD-10-CM

## 2018-05-01 DIAGNOSIS — M5410 Radiculopathy, site unspecified: Secondary | ICD-10-CM

## 2018-05-01 DIAGNOSIS — R200 Anesthesia of skin: Secondary | ICD-10-CM

## 2018-05-01 DIAGNOSIS — M25561 Pain in right knee: Principal | ICD-10-CM

## 2018-05-01 DIAGNOSIS — G5720 Lesion of femoral nerve, unspecified lower limb: Secondary | ICD-10-CM | POA: Diagnosis not present

## 2018-05-01 DIAGNOSIS — M5136 Other intervertebral disc degeneration, lumbar region: Secondary | ICD-10-CM

## 2018-05-01 DIAGNOSIS — G8929 Other chronic pain: Secondary | ICD-10-CM

## 2018-05-01 MED ORDER — GADOBENATE DIMEGLUMINE 529 MG/ML IV SOLN
15.0000 mL | Freq: Once | INTRAVENOUS | Status: AC | PRN
  Administered 2018-05-01: 15 mL via INTRAVENOUS

## 2018-05-05 ENCOUNTER — Other Ambulatory Visit: Payer: Self-pay | Admitting: *Deleted

## 2018-05-05 DIAGNOSIS — M5136 Other intervertebral disc degeneration, lumbar region: Secondary | ICD-10-CM

## 2018-05-05 DIAGNOSIS — S83206S Unspecified tear of unspecified meniscus, current injury, right knee, sequela: Secondary | ICD-10-CM

## 2018-05-05 DIAGNOSIS — M5442 Lumbago with sciatica, left side: Secondary | ICD-10-CM

## 2018-05-05 DIAGNOSIS — M5410 Radiculopathy, site unspecified: Secondary | ICD-10-CM

## 2018-05-05 DIAGNOSIS — R200 Anesthesia of skin: Secondary | ICD-10-CM

## 2018-05-05 DIAGNOSIS — M5137 Other intervertebral disc degeneration, lumbosacral region: Secondary | ICD-10-CM

## 2018-05-05 DIAGNOSIS — M5417 Radiculopathy, lumbosacral region: Secondary | ICD-10-CM

## 2018-05-05 DIAGNOSIS — R202 Paresthesia of skin: Secondary | ICD-10-CM

## 2018-05-13 ENCOUNTER — Ambulatory Visit (INDEPENDENT_AMBULATORY_CARE_PROVIDER_SITE_OTHER): Payer: BLUE CROSS/BLUE SHIELD | Admitting: Orthopaedic Surgery

## 2018-05-13 ENCOUNTER — Encounter (INDEPENDENT_AMBULATORY_CARE_PROVIDER_SITE_OTHER): Payer: Self-pay | Admitting: Orthopaedic Surgery

## 2018-05-13 DIAGNOSIS — S83241A Other tear of medial meniscus, current injury, right knee, initial encounter: Secondary | ICD-10-CM

## 2018-05-13 NOTE — Progress Notes (Signed)
Office Visit Note   Patient: Thomas Mcdonald           Date of Birth: [birth year]           MRN: [id number] Visit Date: 05/13/2018              Requested by: Alycia Rossetti, MD [address] [place], [place] [address] PCP: Alycia Rossetti, MD   Assessment & Plan: Visit Diagnoses:  1. Acute medial meniscus tear, right, initial encounter     Plan: Impression is a complex tear of the medial meniscus as well as mild tricompartmental osteoarthritis.  His symptoms were consistent with the meniscus tear.  At this point I would recommend arthroscopic partial medial meniscectomy as well as debridement as indicated.  We discussed the risks and benefits as well as rehab and recovery.  Questions encouraged and answered.  Patient will call us back when he is ready to schedule surgery.  Follow-Up Instructions: Return for 1 week postop visit.   Orders:  No orders of the defined types were placed in this encounter.  No orders of the defined types were placed in this encounter.     Procedures: No procedures performed   Clinical Data: No additional findings.   Subjective: Chief Complaint  Patient presents with  . Right Knee - Pain, Follow-up    They are is a 58 year old gentleman who has had a right knee pain for approximately 6 months.  He states that he was moving a hutch when he injured his right knee.  He has tried meloxicam which has not helped.  He endorses pain with increased activity and weightbearing localized to the medial aspect of the knee.  He denies any significant swelling.   Review of Systems  Constitutional: Negative.   All other systems reviewed and are negative.    Objective: Vital Signs: Ht 5\' 9"  (1.753 m)   Wt 181 lb (82.1 kg)   BMI 26.73 kg/m   Physical Exam Vitals signs and nursing note reviewed.  Constitutional:      Appearance: He is well-developed.  HENT:     Head: Normocephalic and atraumatic.  Eyes:     Pupils: Pupils are equal,  round, and reactive to light.  Neck:     Musculoskeletal: Neck supple.  Pulmonary:     Effort: Pulmonary effort is normal.  Abdominal:     Palpations: Abdomen is soft.  Musculoskeletal: Normal range of motion.  Skin:    General: Skin is warm.  Neurological:     Mental Status: He is alert and oriented to person, place, and time.  Psychiatric:        Behavior: Behavior normal.        Thought Content: Thought content normal.        Judgment: Judgment normal.     Ortho Exam Right knee exam shows a trace joint effusion.  Medial joint line tenderness.  Collaterals and cruciates are stable.  Normal range of motion.  Minimal patellofemoral crepitus. Specialty Comments:  No specialty comments available.  Imaging: No results found.   PMFS History: Patient Active Problem List   Diagnosis Date Noted  . Acute medial meniscus tear, right, initial encounter 05/13/2018   Past Medical History:  Diagnosis Date  . Asthma     Family History  Problem Relation Age of Onset  . Cancer Mother   . Heart disease Mother   . Alcohol abuse Father   . Heart disease Father  History reviewed. No pertinent surgical history. Social History   Occupational History  . Not on file  Tobacco Use  . Smoking status: Former Smoker    Types: Cigarettes    Last attempt to quit: 05/14/2016    Years since quitting: 1.9  . Smokeless tobacco: Never Used  Substance and Sexual Activity  . Alcohol use: Yes    Comment: Occ  . Drug use: No  . Sexual activity: Not Currently

## 2018-06-16 ENCOUNTER — Encounter (HOSPITAL_BASED_OUTPATIENT_CLINIC_OR_DEPARTMENT_OTHER): Payer: Self-pay | Admitting: *Deleted

## 2018-06-16 ENCOUNTER — Other Ambulatory Visit: Payer: Self-pay

## 2018-06-18 ENCOUNTER — Telehealth (INDEPENDENT_AMBULATORY_CARE_PROVIDER_SITE_OTHER): Payer: Self-pay | Admitting: Orthopaedic Surgery

## 2018-06-18 NOTE — Telephone Encounter (Signed)
Ria Comment verified the updated FMLA forms and I Faxed updated FMLA forms completed by [employer] to [phone] [employer]

## 2018-06-24 ENCOUNTER — Telehealth (INDEPENDENT_AMBULATORY_CARE_PROVIDER_SITE_OTHER): Payer: Self-pay | Admitting: Orthopaedic Surgery

## 2018-06-24 NOTE — Telephone Encounter (Signed)
I called and advised still going as scheduled as of this moment.

## 2018-06-24 NOTE — Telephone Encounter (Signed)
Pt called asking if the surgery is still a go. She has some questions so would like to be called back.  Husbands surgery is at 7 am

## 2018-06-25 ENCOUNTER — Encounter (HOSPITAL_BASED_OUTPATIENT_CLINIC_OR_DEPARTMENT_OTHER)
Admission: RE | Disposition: A | Discharge status: Home / Self Care | Payer: Self-pay | Source: Home / Self Care | Attending: Orthopaedic Surgery

## 2018-06-25 ENCOUNTER — Encounter (HOSPITAL_BASED_OUTPATIENT_CLINIC_OR_DEPARTMENT_OTHER): Payer: Self-pay

## 2018-06-25 ENCOUNTER — Other Ambulatory Visit: Payer: Self-pay

## 2018-06-25 ENCOUNTER — Ambulatory Visit (HOSPITAL_BASED_OUTPATIENT_CLINIC_OR_DEPARTMENT_OTHER): Payer: BLUE CROSS/BLUE SHIELD | Admitting: Anesthesiology

## 2018-06-25 ENCOUNTER — Encounter: Payer: Self-pay | Admitting: Orthopaedic Surgery

## 2018-06-25 ENCOUNTER — Ambulatory Visit (HOSPITAL_BASED_OUTPATIENT_CLINIC_OR_DEPARTMENT_OTHER)
Admission: RE | Admit: 2018-06-25 | Discharge: 2018-06-25 | Disposition: A | Discharge status: Home / Self Care | Payer: BLUE CROSS/BLUE SHIELD | Attending: Orthopaedic Surgery | Admitting: Orthopaedic Surgery

## 2018-06-25 DIAGNOSIS — F17200 Nicotine dependence, unspecified, uncomplicated: Secondary | ICD-10-CM | POA: Insufficient documentation

## 2018-06-25 DIAGNOSIS — M659 Synovitis and tenosynovitis, unspecified: Secondary | ICD-10-CM | POA: Diagnosis not present

## 2018-06-25 DIAGNOSIS — F17210 Nicotine dependence, cigarettes, uncomplicated: Secondary | ICD-10-CM | POA: Insufficient documentation

## 2018-06-25 DIAGNOSIS — S83241A Other tear of medial meniscus, current injury, right knee, initial encounter: Secondary | ICD-10-CM | POA: Diagnosis not present

## 2018-06-25 DIAGNOSIS — M94261 Chondromalacia, right knee: Secondary | ICD-10-CM | POA: Insufficient documentation

## 2018-06-25 DIAGNOSIS — M23221 Derangement of posterior horn of medial meniscus due to old tear or injury, right knee: Secondary | ICD-10-CM | POA: Insufficient documentation

## 2018-06-25 DIAGNOSIS — S83281A Other tear of lateral meniscus, current injury, right knee, initial encounter: Secondary | ICD-10-CM | POA: Diagnosis not present

## 2018-06-25 HISTORY — PX: KNEE ARTHROSCOPY WITH MEDIAL MENISECTOMY: SHX5651

## 2018-06-25 HISTORY — PX: SYNOVECTOMY: SHX5180

## 2018-06-25 SURGERY — ARTHROSCOPY, KNEE, WITH MEDIAL MENISCECTOMY
Anesthesia: General | Site: Knee | Laterality: Right

## 2018-06-25 MED ORDER — ACETAMINOPHEN 325 MG PO TABS: 325.0000 mg | ORAL_TABLET | Freq: Once | ORAL | Status: DC

## 2018-06-25 MED ORDER — CHLORHEXIDINE GLUCONATE 4 % EX LIQD: 60.0000 mL | Freq: Once | CUTANEOUS | Status: DC

## 2018-06-25 MED ORDER — MIDAZOLAM HCL 2 MG/2ML IJ SOLN
1.0000 mg | INTRAMUSCULAR | Status: DC | PRN
  Administered 2018-06-25: 2 mg via INTRAVENOUS

## 2018-06-25 MED ORDER — SCOPOLAMINE 1 MG/3DAYS TD PT72: 1.0000 | MEDICATED_PATCH | Freq: Once | TRANSDERMAL | Status: DC | PRN

## 2018-06-25 MED ORDER — PROPOFOL 10 MG/ML IV BOLUS
INTRAVENOUS | Status: DC | PRN
  Administered 2018-06-25: 180 mg via INTRAVENOUS

## 2018-06-25 MED ORDER — LIDOCAINE HCL (CARDIAC) PF 100 MG/5ML IV SOSY
PREFILLED_SYRINGE | INTRAVENOUS | Status: DC | PRN
  Administered 2018-06-25: 100 mg via INTRAVENOUS

## 2018-06-25 MED ORDER — ONDANSETRON HCL 4 MG PO TABS: 4.0000 mg | ORAL_TABLET | Freq: Three times a day (TID) | ORAL | 0 refills | Status: DC | PRN

## 2018-06-25 MED ORDER — SUCCINYLCHOLINE CHLORIDE 20 MG/ML IJ SOLN
INTRAMUSCULAR | Status: DC | PRN
  Administered 2018-06-25: 100 mg via INTRAVENOUS

## 2018-06-25 MED ORDER — CEFAZOLIN SODIUM-DEXTROSE 2-4 GM/100ML-% IV SOLN
2.0000 g | INTRAVENOUS | Status: AC
  Administered 2018-06-25: 2 g via INTRAVENOUS

## 2018-06-25 MED ORDER — ONDANSETRON HCL 4 MG/2ML IJ SOLN
INTRAMUSCULAR | Status: AC
  Filled 2018-06-25: qty 2

## 2018-06-25 MED ORDER — DEXAMETHASONE SODIUM PHOSPHATE 10 MG/ML IJ SOLN
INTRAMUSCULAR | Status: AC
  Filled 2018-06-25: qty 1

## 2018-06-25 MED ORDER — ONDANSETRON HCL 4 MG/2ML IJ SOLN
INTRAMUSCULAR | Status: DC | PRN
  Administered 2018-06-25: 4 mg via INTRAVENOUS

## 2018-06-25 MED ORDER — MIDAZOLAM HCL 2 MG/2ML IJ SOLN
INTRAMUSCULAR | Status: AC
  Filled 2018-06-25: qty 2

## 2018-06-25 MED ORDER — LIDOCAINE 2% (20 MG/ML) 5 ML SYRINGE
INTRAMUSCULAR | Status: AC
  Filled 2018-06-25: qty 5

## 2018-06-25 MED ORDER — BUPIVACAINE HCL (PF) 0.25 % IJ SOLN
INTRAMUSCULAR | Status: DC | PRN
  Administered 2018-06-25: 20 mL

## 2018-06-25 MED ORDER — OXYCODONE HCL 5 MG/5ML PO SOLN: 5.0000 mg | Freq: Once | ORAL | Status: DC | PRN

## 2018-06-25 MED ORDER — ACETAMINOPHEN 10 MG/ML IV SOLN: 1000.0000 mg | Freq: Once | INTRAVENOUS | Status: DC | PRN

## 2018-06-25 MED ORDER — LACTATED RINGERS IV SOLN
INTRAVENOUS | Status: DC
  Administered 2018-06-25: 09:00:00 via INTRAVENOUS

## 2018-06-25 MED ORDER — HYDROMORPHONE HCL 1 MG/ML IJ SOLN: 0.2500 mg | INTRAMUSCULAR | Status: DC | PRN

## 2018-06-25 MED ORDER — CEFAZOLIN SODIUM-DEXTROSE 2-4 GM/100ML-% IV SOLN
INTRAVENOUS | Status: AC
  Filled 2018-06-25: qty 100

## 2018-06-25 MED ORDER — PROPOFOL 10 MG/ML IV BOLUS
INTRAVENOUS | Status: AC
  Filled 2018-06-25: qty 40

## 2018-06-25 MED ORDER — DEXAMETHASONE SODIUM PHOSPHATE 4 MG/ML IJ SOLN
INTRAMUSCULAR | Status: DC | PRN
  Administered 2018-06-25: 10 mg via INTRAVENOUS

## 2018-06-25 MED ORDER — MEPERIDINE HCL 25 MG/ML IJ SOLN: 6.2500 mg | INTRAMUSCULAR | Status: DC | PRN

## 2018-06-25 MED ORDER — PROMETHAZINE HCL 25 MG/ML IJ SOLN: 6.2500 mg | INTRAMUSCULAR | Status: DC | PRN

## 2018-06-25 MED ORDER — LACTATED RINGERS IV SOLN: INTRAVENOUS | Status: DC

## 2018-06-25 MED ORDER — FENTANYL CITRATE (PF) 100 MCG/2ML IJ SOLN
50.0000 ug | INTRAMUSCULAR | Status: DC | PRN
  Administered 2018-06-25: 100 ug via INTRAVENOUS

## 2018-06-25 MED ORDER — OXYCODONE HCL 5 MG PO TABS: 5.0000 mg | ORAL_TABLET | Freq: Once | ORAL | Status: DC | PRN

## 2018-06-25 MED ORDER — FENTANYL CITRATE (PF) 100 MCG/2ML IJ SOLN
INTRAMUSCULAR | Status: AC
  Filled 2018-06-25: qty 2

## 2018-06-25 MED ORDER — ACETAMINOPHEN 160 MG/5ML PO SOLN: 325.0000 mg | Freq: Once | ORAL | Status: DC

## 2018-06-25 MED ORDER — HYDROCODONE-ACETAMINOPHEN 5-325 MG PO TABS: 1.0000 | ORAL_TABLET | Freq: Four times a day (QID) | ORAL | 0 refills | Status: DC | PRN

## 2018-06-25 SURGICAL SUPPLY — 39 items
BANDAGE ACE 6X5 VEL STRL LF (GAUZE/BANDAGES/DRESSINGS) ×4 IMPLANT
BANDAGE ESMARK 6X9 LF (GAUZE/BANDAGES/DRESSINGS) IMPLANT
BLADE 4.2CUDA (BLADE) IMPLANT
BLADE CUDA GRT WHITE 3.5 (BLADE) IMPLANT
BLADE CUDA SHAVER 3.5 (BLADE) IMPLANT
BLADE CUTTER GATOR 3.5 (BLADE) IMPLANT
BLADE GREAT WHITE 4.2 (BLADE) IMPLANT
BLADE LANZA CVD 15 DEG (BLADE) IMPLANT
BNDG ESMARK 6X9 LF (GAUZE/BANDAGES/DRESSINGS)
COVER WAND RF STERILE (DRAPES) IMPLANT
CUFF TOURN SGL QUICK 34 (TOURNIQUET CUFF) ×1
CUFF TRNQT CYL 34X4.125X (TOURNIQUET CUFF) ×1 IMPLANT
DRAPE ARTHROSCOPY W/POUCH 90 (DRAPES) ×2 IMPLANT
DRAPE IMP U-DRAPE 54X76 (DRAPES) ×2 IMPLANT
DRAPE U-SHAPE 47X51 STRL (DRAPES) ×2 IMPLANT
DURAPREP 26ML APPLICATOR (WOUND CARE) ×2 IMPLANT
GAUZE SPONGE 4X4 12PLY STRL (GAUZE/BANDAGES/DRESSINGS) ×2 IMPLANT
GAUZE XEROFORM 1X8 LF (GAUZE/BANDAGES/DRESSINGS) ×2 IMPLANT
GLOVE BIOGEL PI IND STRL 7.0 (GLOVE) ×1 IMPLANT
GLOVE BIOGEL PI INDICATOR 7.0 (GLOVE) ×1
GLOVE ECLIPSE 7.0 STRL STRAW (GLOVE) ×2 IMPLANT
GLOVE SKINSENSE NS SZ7.5 (GLOVE) ×1
GLOVE SKINSENSE STRL SZ7.5 (GLOVE) ×1 IMPLANT
GLOVE SURG SYN 7.5  E (GLOVE) ×2
GLOVE SURG SYN 7.5 E (GLOVE) ×2 IMPLANT
GOWN STRL REIN XL XLG (GOWN DISPOSABLE) ×2 IMPLANT
GOWN STRL REUS W/ TWL LRG LVL3 (GOWN DISPOSABLE) ×2 IMPLANT
GOWN STRL REUS W/ TWL XL LVL3 (GOWN DISPOSABLE) ×1 IMPLANT
GOWN STRL REUS W/TWL LRG LVL3 (GOWN DISPOSABLE) ×2
GOWN STRL REUS W/TWL XL LVL3 (GOWN DISPOSABLE) ×1
KNEE WRAP E Z 3 GEL PACK (MISCELLANEOUS) ×2 IMPLANT
MANIFOLD NEPTUNE II (INSTRUMENTS) ×2 IMPLANT
PACK ARTHROSCOPY DSU (CUSTOM PROCEDURE TRAY) ×2 IMPLANT
PACK BASIN DAY SURGERY FS (CUSTOM PROCEDURE TRAY) ×2 IMPLANT
RESECTOR FULL RADIUS 4.2MM (BLADE) IMPLANT
SHAVER 4.2 MM LANZA 9391A (BLADE) ×2 IMPLANT
SUT ETHILON 3 0 PS 1 (SUTURE) ×2 IMPLANT
TOWEL GREEN STERILE FF (TOWEL DISPOSABLE) ×2 IMPLANT
TUBING ARTHRO INFLOW-ONLY STRL (TUBING) ×2 IMPLANT

## 2018-06-25 NOTE — Discharge Instructions (Signed)
°Post Anesthesia Home Care Instructions ° °Activity: °Get plenty of rest for the remainder of the day. A responsible individual must stay with you for 24 hours following the procedure.  °For the next 24 hours, DO NOT: °-Drive a car °-Operate machinery °-Drink alcoholic beverages °-Take any medication unless instructed by your physician °-Make any legal decisions or sign important papers. ° °Meals: °Start with liquid foods such as gelatin or soup. Progress to regular foods as tolerated. Avoid greasy, spicy, heavy foods. If nausea and/or vomiting occur, drink only clear liquids until the nausea and/or vomiting subsides. Call your physician if vomiting continues. ° °Special Instructions/Symptoms: °Your throat may feel dry or sore from the anesthesia or the breathing tube placed in your throat during surgery. If this causes discomfort, gargle with warm salt water. The discomfort should disappear within 24 hours. ° °If you had a scopolamine patch placed behind your ear for the management of post- operative nausea and/or vomiting: ° °1. The medication in the patch is effective for 72 hours, after which it should be removed.  Wrap patch in a tissue and discard in the trash. Wash hands thoroughly with soap and water. °2. You may remove the patch earlier than 72 hours if you experience unpleasant side effects which may include dry mouth, dizziness or visual disturbances. °3. Avoid touching the patch. Wash your hands with soap and water after contact with the patch. °  ° ° ° ° ° ° °Post-operative patient instructions  °Knee Arthroscopy  ° °• Ice:  Place intermittent ice or cooler pack over your knee, 30 minutes on and 30 minutes off.  Continue this for the first 72 hours after surgery, then save ice for use after therapy sessions or on more active days.   °• Weight:  You may bear weight on your leg as your symptoms allow. °• Crutches:  Use crutches (or walker) to assist in walking until told to discontinue by your physical  therapist or physician. This will help to reduce pain. °• Strengthening:  Perform simple thigh squeezes (isometric quad contractions) and straight leg lifts as you are able (3 sets of 5 to 10 repetitions, 3 times a day).  For the leg lifts, have someone support under your ankle in the beginning until you have increased strength enough to do this on your own.  To help get started on thigh squeezes, place a pillow under your knee and push down on the pillow with back of knee (sometimes easier to do than with your leg fully straight). °• Motion:  Perform gentle knee motion as tolerated - this is gentle bending and straightening of the knee. Seated heel slides: you can start by sitting in a chair, remove your brace, and gently slide your heel back on the floor - allowing your knee to bend. Have someone help you straighten your knee (or use your other leg/foot hooked under your ankle.  °• Dressing:  Perform 1st dressing change at 2 days postoperative. A moderate amount of blood tinged drainage is to be expected.  So if you bleed through the dressing on the first or second day or if you have fevers, it is fine to change the dressing/check the wounds early and redress wound. Elevate your leg.  If it bleeds through again, or if the incisions are leaking frank blood, please call the office. May change dressing every 1-2 days thereafter to help watch wounds. Can purchase Tegaderm (or 3M Nexcare) water resistant dressings at local pharmacy / [facility]. °• Shower:    Light shower is ok after 2 days.  Please take shower, NO bath. Recover with gauze and ace wrap to help keep wounds protected.   °• Pain medication:  A narcotic pain medication has been prescribed.  Take as directed.  Typically you need narcotic pain medication more regularly during the first 3 to 5 days after surgery.  Decrease your use of the medication as the pain improves.  Narcotics can sometimes cause constipation, even after a few doses.  If you have problems  with constipation, you can take an over the counter stool softener or light laxative.  If you have persistent problems, please notify your physician’s office. °• Physical therapy: Additional activity guidelines to be provided by your physician or physical therapist at follow-up visits.  °• Driving: Do not recommend driving x 2 weeks post surgical, especially if surgery performed on right side. Should not drive while taking narcotic pain medications. It typically takes at least 2 weeks to restore sufficient neuromuscular function for normal reaction times for driving safety.  °• Call [phone] for questions or problems. Evenings you will be forwarded to the hospital operator.  Ask for the orthopaedic physician on call. Please call if you experience:  °  °o Redness, foul smelling, or persistent drainage from the surgical site  °o worsening knee pain and swelling not responsive to medication  °o any calf pain and or swelling of the lower leg  °o temperatures greater than 101.5 F °o other questions or concerns ° ° °Thank you for allowing us to be a part of your care. ° °

## 2018-06-25 NOTE — Transfer of Care (Signed)
Immediate Anesthesia Transfer of Care Note  Patient: Thomas Mcdonald  Procedure(s) Performed: RIGHT KNEE ARTHROSCOPY WITH PARTIAL MEDIAL MENISCECTOMY (Right Knee) SYNOVECTOMY (Right Knee)  Patient Location: PACU  Anesthesia Type:General  Level of Consciousness: awake, alert  and oriented  Airway & Oxygen Therapy: Patient Spontanous Breathing and Patient connected to face mask oxygen  Post-op Assessment: Report given to RN and Post -op Vital signs reviewed and stable  Post vital signs: Reviewed and stable  Last Vitals:  Vitals Value Taken Time  BP    Temp    Pulse 84 06/25/2018 10:25 AM  Resp 15 06/25/2018 10:25 AM  SpO2 100 % 06/25/2018 10:25 AM  Vitals shown include unvalidated device data.  Last Pain:  Vitals:   06/25/18 0802  TempSrc: Oral  PainSc: 0-No pain         Complications: No apparent anesthesia complications

## 2018-06-25 NOTE — Anesthesia Postprocedure Evaluation (Signed)
Anesthesia Post Note  Patient: Eisa Folkerts  Procedure(s) Performed: RIGHT KNEE ARTHROSCOPY WITH PARTIAL MEDIAL MENISCECTOMY (Right Knee) SYNOVECTOMY (Right Knee)     Patient location during evaluation: PACU Anesthesia Type: General Level of consciousness: awake and alert Pain management: pain level controlled Vital Signs Assessment: post-procedure vital signs reviewed and stable Respiratory status: spontaneous breathing, nonlabored ventilation, respiratory function stable and patient connected to nasal cannula oxygen Cardiovascular status: blood pressure returned to baseline and stable Postop Assessment: no apparent nausea or vomiting Anesthetic complications: no    Last Vitals:  Vitals:   06/25/18 1030 06/25/18 1045  BP: (!) 81/71 (!) 114/100  Pulse: 85 71  Resp: 16 14  Temp: (!) 36.4 C   SpO2: 100% 100%    Last Pain:  Vitals:   06/25/18 1045  TempSrc:   PainSc: 0-No pain                 Effie Berkshire

## 2018-06-25 NOTE — Op Note (Signed)
° °  Surgery Date: 06/25/2018  Surgeon(s): Leandrew Koyanagi, MD  ASSIST: Madalyn Rob, [place]; necessary for the timely completion of procedure and due to complexity of procedure.  ANESTHESIA:  general  FLUIDS: Per anesthesia record.   ESTIMATED BLOOD LOSS: minimal  PREOPERATIVE DIAGNOSES:  1. Right knee medial meniscus tear 2. 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 Right knee arthroscopy with major synovectomy 2. Right knee arthroscopy with arthroscopic partial medial meniscectomy 3. Right knee arthroscopy with arthroscopic chondroplasty medial femoral condyle.  DESCRIPTION OF PROCEDURE: Mr. Thomas Mcdonald is a 58 y.o.-year-old male with right knee medial meniscus tear. Plans are to proceed with partial medial meniscectomy and diagnostic arthroscopy with debridement as indicated. Full discussion held regarding risks benefits alternatives and complications related surgical intervention. Conservative care options reviewed. All questions answered.  The patient was identified in the preoperative holding area and the operative extremity was marked. The patient was brought to the operating room and transferred to operating table in a supine position. Satisfactory general anesthesia was induced by anesthesiology.    Standard anterolateral, anteromedial arthroscopy portals were obtained. The anteromedial portal was obtained with a spinal needle for localization under direct visualization with subsequent diagnostic findings.   Incisions were made for knee arthroscopy portals.  Diagnostic knee arthroscopy was first performed.  Major synovectomy was performed using oscillating shaver and all 3 compartments.  We then placed the knee in a valgus position to address the medial compartment.  Medial femoral condyle exhibited grade III chondromalacia throughout the extension and flexion weightbearing surface.  The medial tibial plateau  exhibited grade 2 changes.  We then able to visualize the horizontal cleavage degenerative tear of the posterior horn medial meniscus which was then probed.  Partial medial meniscectomy was then performed using a meniscus basket and oscillating shaver back to stable border.  We then repositioned the arthroscope in the lateral compartment which was unremarkable.  The knee was then placed into extension to evaluate the patellofemoral compartment.  The femoral trochlea exhibited a focal area of grade III chondromalacia that was less than 1 cm.  The patella was unremarkable.  Excess fluid was drained from the knee.  Incisions were closed with interrupted nylon sutures.  Sterile dressings were applied.  Patient tolerated procedure well had no immediate complications.  Suprapatellar pouch and gutters: moderate synovitis or debris. Patella chondral surface: Grade 1 Trochlear chondral surface: Grade focal grade 3 lesion less than 1 cm Patellofemoral tracking: normal Medial meniscus: horizontal cleavage degenerative tear, posterior horn.  Medial femoral condyle flexion bearing surface: Grade 3 Medial femoral condyle extension bearing surface: Grade 3 Medial tibial plateau: Grade 2 Anterior cruciate ligament:stable Posterior cruciate ligament:stable Lateral meniscus: normal.   Lateral femoral condyle flexion bearing surface: Grade 0 Lateral femoral condyle extension bearing surface: Grade 0 Lateral tibial plateau: Grade 0  DISPOSITION: The patient was awakened from general anesthetic, extubated, taken to the recovery room in medically stable condition, no apparent complications. The patient may be weightbearing as tolerated to the operative lower extremity.  Range of motion of right knee as tolerated.  [name], MD [place] 10:14 AM

## 2018-06-25 NOTE — H&P (Signed)
PREOPERATIVE H&P  Chief Complaint: right knee complex medial meniscal tear  HPI: Thomas Mcdonald is a 58 y.o. male who presents for surgical treatment of right knee complex medial meniscal tear.  He denies any changes in medical history.  Past Medical History:  Diagnosis Date  . Asthma    no inhaler   Past Surgical History:  Procedure Laterality Date  . NO PAST SURGERIES     Social History   Socioeconomic History  . Marital status: Single    Spouse name: Not on file  . Number of children: Not on file  . Years of education: Not on file  . Highest education level: Not on file  Occupational History  . Not on file  Social Needs  . Financial resource strain: Not on file  . Food insecurity:    Worry: Not on file    Inability: Not on file  . Transportation needs:    Medical: Not on file    Non-medical: Not on file  Tobacco Use  . Smoking status: Current Every Day Smoker    Packs/day: 0.50    Types: Cigarettes  . Smokeless tobacco: Never Used  Substance and Sexual Activity  . Alcohol use: Yes    Alcohol/week: 21.0 standard drinks    Types: 21 Cans of beer per week    Comment: wife describes drinking a moderate amt daily  . Drug use: No    Comment: H/O drug abuse. Prefers minimal narcotics  . Sexual activity: Not Currently  Lifestyle  . Physical activity:    Days per week: Not on file    Minutes per session: Not on file  . Stress: Not on file  Relationships  . Social connections:    Talks on phone: Not on file    Gets together: Not on file    Attends religious service: Not on file    Active member of club or organization: Not on file    Attends meetings of clubs or organizations: Not on file    Relationship status: Not on file  Other Topics Concern  . Not on file  Social History Narrative  . Not on file   Family History  Problem Relation Age of Onset  . Cancer Mother   . Heart disease Mother   . Alcohol abuse Father   . Heart disease Father     Allergies  Allergen Reactions  . Shellfish Allergy Hives   Prior to Admission medications   Medication Sig Start Date End Date Taking? Authorizing Provider  Acetaminophen (TYLENOL ARTHRITIS EXT RELIEF PO) Take by mouth.   Yes [provider]  Multiple Vitamin (MULTIVITAMIN WITH MINERALS) TABS tablet Take 1 tablet by mouth daily.   Yes [provider]     Positive ROS: All other systems have been reviewed and were otherwise negative with the exception of those mentioned in the HPI and as above.  Physical Exam: General: Alert, no acute distress Cardiovascular: No pedal edema Respiratory: No cyanosis, no use of accessory musculature GI: abdomen soft Skin: No lesions in the area of chief complaint Neurologic: Sensation intact distally Psychiatric: Patient is competent for consent with normal mood and affect Lymphatic: no lymphedema  MUSCULOSKELETAL: exam stable  Assessment: right knee complex medial meniscal tear  Plan: Plan for Procedure(s): RIGHT KNEE ARTHROSCOPY WITH PARTIAL MEDIAL MENISCECTOMY  The risks benefits and alternatives were discussed with the patient including but not limited to the risks of nonoperative treatment, versus surgical intervention including infection, bleeding, nerve injury,  blood clots, cardiopulmonary complications, morbidity, mortality, among others, and they were willing to proceed.   Eduard Roux, MD   06/25/2018 8:13 AM

## 2018-06-25 NOTE — Anesthesia Procedure Notes (Signed)
Procedure Name: Intubation Performed by: Verita Lamb, CRNA Pre-anesthesia Checklist: Patient identified, Emergency Drugs available, Suction available, Patient being monitored and Timeout performed Patient Re-evaluated:Patient Re-evaluated prior to induction Oxygen Delivery Method: Circle system utilized Preoxygenation: Pre-oxygenation with 100% oxygen Induction Type: IV induction Laryngoscope Size: Mac and 4 Grade View: Grade I Tube type: Oral Tube size: 7.0 mm Number of attempts: 1 Airway Equipment and Method: Stylet Placement Confirmation: ETT inserted through vocal cords under direct vision,  CO2 detector,  positive ETCO2 and breath sounds checked- equal and bilateral Secured at: 23 cm Tube secured with: Tape Dental Injury: Teeth and Oropharynx as per pre-operative assessment

## 2018-06-25 NOTE — Anesthesia Preprocedure Evaluation (Addendum)
Anesthesia Evaluation  Patient identified by MRN, date of birth, ID band Patient awake    Reviewed: Allergy & Precautions, NPO status , Patient's Chart, lab work & pertinent test results  Airway Mallampati: I  TM Distance: >3 FB Neck ROM: Full    Dental  (+) Chipped, Dental Advisory Given,    Pulmonary asthma , Current Smoker,    breath sounds clear to auscultation       Cardiovascular negative cardio ROS   Rhythm:Regular Rate:Normal     Neuro/Psych negative neurological ROS  negative psych ROS   GI/Hepatic negative GI ROS, Neg liver ROS,   Endo/Other  negative endocrine ROS  Renal/GU negative Renal ROS     Musculoskeletal negative musculoskeletal ROS (+)   Abdominal Normal abdominal exam  (+)   Peds  Hematology negative hematology ROS (+)   Anesthesia Other Findings   Reproductive/Obstetrics negative OB ROS                            Anesthesia Physical Anesthesia Plan  ASA: II  Anesthesia Plan: General   Post-op Pain Management:    Induction: Intravenous  PONV Risk Score and Plan: 2 and Ondansetron, Dexamethasone and Midazolam  Airway Management Planned: LMA  Additional Equipment: None  Intra-op Plan:   Post-operative Plan: Extubation in OR  Informed Consent: I have reviewed the patients History and Physical, chart, labs and discussed the procedure including the risks, benefits and alternatives for the proposed anesthesia with the patient or authorized representative who has indicated his/her understanding and acceptance.     Dental advisory given  Plan Discussed with: CRNA  Anesthesia Plan Comments:         Anesthesia Quick Evaluation

## 2018-06-27 ENCOUNTER — Encounter (HOSPITAL_BASED_OUTPATIENT_CLINIC_OR_DEPARTMENT_OTHER): Payer: Self-pay | Admitting: Orthopaedic Surgery

## 2018-07-02 ENCOUNTER — Inpatient Hospital Stay (INDEPENDENT_AMBULATORY_CARE_PROVIDER_SITE_OTHER): Payer: Self-pay | Admitting: Orthopaedic Surgery

## 2018-07-02 ENCOUNTER — Ambulatory Visit (INDEPENDENT_AMBULATORY_CARE_PROVIDER_SITE_OTHER): Payer: BLUE CROSS/BLUE SHIELD | Admitting: Orthopaedic Surgery

## 2018-07-02 ENCOUNTER — Telehealth (INDEPENDENT_AMBULATORY_CARE_PROVIDER_SITE_OTHER): Payer: Self-pay

## 2018-07-02 ENCOUNTER — Other Ambulatory Visit: Payer: Self-pay

## 2018-07-02 ENCOUNTER — Encounter (INDEPENDENT_AMBULATORY_CARE_PROVIDER_SITE_OTHER): Payer: Self-pay | Admitting: Orthopaedic Surgery

## 2018-07-02 DIAGNOSIS — S83241A Other tear of medial meniscus, current injury, right knee, initial encounter: Secondary | ICD-10-CM

## 2018-07-02 NOTE — Progress Notes (Signed)
Patient ID: Thomas Mcdonald, male   DOB: [birth year], 58 y.o.   MRN: [id number]  Thomas Mcdonald is 1 week status post right knee arthroscopy with partial medial meniscectomy.  He is overall doing well and denies any significant pain.  He does still have expected postoperative quad weakness.  His range of motion is improving significantly.  He denies any constitutional symptoms.  The portal sites are healed without any signs of infection.  We remove the sutures today.  For now would like him to remain out of work so that he can still work on improving his strength and ambulation so that he can be ready to go back to work at [employer].  He will continue with his home exercises.  I would like to recheck him in 5 weeks.  I would anticipate that he will be ready to return back to work at that time.  If he does feel that he improves significantly enough before the 5 weeks that he feels ready to return back to work I have asked him to contact us so that we can release him back.

## 2018-07-02 NOTE — Telephone Encounter (Signed)
Thomas Mcdonald called and left VM on patient. Wanted to confirm SU Date.   CB [phone] Ext  [id number]  Claim number [id number]  Called her no answer LMOM with details.

## 2018-08-08 ENCOUNTER — Ambulatory Visit (INDEPENDENT_AMBULATORY_CARE_PROVIDER_SITE_OTHER): Payer: BLUE CROSS/BLUE SHIELD | Admitting: Physician Assistant

## 2018-08-08 ENCOUNTER — Other Ambulatory Visit: Payer: Self-pay

## 2018-08-08 ENCOUNTER — Encounter (INDEPENDENT_AMBULATORY_CARE_PROVIDER_SITE_OTHER): Payer: Self-pay | Admitting: Physician Assistant

## 2018-08-08 DIAGNOSIS — Z9889 Other specified postprocedural states: Secondary | ICD-10-CM

## 2018-08-08 NOTE — Progress Notes (Signed)
   Post-Op Visit Note   Patient: Thomas Mcdonald           Date of Birth: 1960/04/20           MRN: [id number] Visit Date: 08/08/2018 PCP: Alycia Rossetti, MD   Assessment & Plan:  Chief Complaint:  Chief Complaint  Patient presents with  . Right Knee - Follow-up   Visit Diagnoses:  1. S/P right knee arthroscopy     Plan: Patient is a pleasant 58 year old gentleman who presents our clinic today 44 days status post right knee arthroscopic debridement medial meniscus, synovectomy and chondroplasty.  It was noted during operative mention he had grade 2 changes medial compartment with a focal grade 3 change to femoral trochlea.  He has been doing very well over the past few weeks.  He has very minimal pain.  No swelling.  No fevers or chills.  Examination of his right knee reveals well-healed surgical portals without evidence of infection or cellulitis.  Range of motion 0 to 130 degrees.  Calf is soft and nontender.  He is neurovascular intact distally.  At this point, we will continue to advance with activity as tolerated.  Work note provided today.  Follow-up with [place] as needed.  Follow-Up Instructions: Return if symptoms worsen or fail to improve.   Orders:  No orders of the defined types were placed in this encounter.  No orders of the defined types were placed in this encounter.   Imaging: No new imaging  PMFS History: Patient Active Problem List   Diagnosis Date Noted  . S/P right knee arthroscopy 08/08/2018  . Acute medial meniscus tear, right, initial encounter 05/13/2018   Past Medical History:  Diagnosis Date  . Asthma    no inhaler    Family History  Problem Relation Age of Onset  . Cancer Mother   . Heart disease Mother   . Alcohol abuse Father   . Heart disease Father     Past Surgical History:  Procedure Laterality Date  . KNEE ARTHROSCOPY WITH MEDIAL MENISECTOMY Right 06/25/2018   Procedure: RIGHT KNEE ARTHROSCOPY WITH PARTIAL MEDIAL MENISCECTOMY;   Surgeon: Leandrew Koyanagi, MD;  Location: [place];  Service: Orthopedics;  Laterality: Right;  . NO PAST SURGERIES    . SYNOVECTOMY Right 06/25/2018   Procedure: SYNOVECTOMY;  Surgeon: Leandrew Koyanagi, MD;  Location: [place];  Service: Orthopedics;  Laterality: Right;   Social History   Occupational History  . Not on file  Tobacco Use  . Smoking status: Current Every Day Smoker    Packs/day: 0.50    Types: Cigarettes  . Smokeless tobacco: Never Used  Substance and Sexual Activity  . Alcohol use: Yes    Alcohol/week: 21.0 standard drinks    Types: 21 Cans of beer per week    Comment: wife describes drinking a moderate amt daily  . Drug use: No    Comment: H/O drug abuse. Prefers minimal narcotics  . Sexual activity: Not Currently

## 2018-08-29 ENCOUNTER — Ambulatory Visit (INDEPENDENT_AMBULATORY_CARE_PROVIDER_SITE_OTHER): Payer: BLUE CROSS/BLUE SHIELD | Admitting: Family Medicine

## 2018-08-29 ENCOUNTER — Other Ambulatory Visit: Payer: Self-pay

## 2018-08-29 DIAGNOSIS — R059 Cough, unspecified: Secondary | ICD-10-CM

## 2018-08-29 DIAGNOSIS — R05 Cough: Secondary | ICD-10-CM

## 2018-08-29 NOTE — Progress Notes (Signed)
Virtual Visit via Telephone Note  I connected with Thomas Mcdonald on 08/29/18 at 3:09pm  by telephone and verified that I am speaking with the correct person using two identifiers.      Pt location: at home   Physician location:  In office, [facility], Vic Blackbird MD     On call: patient and physician   I discussed the limitations, risks, security and privacy concerns of performing an evaluation and management service by telephone and the availability of in person appointments. I also discussed with the patient that there may be a patient responsible charge related to this service. The patient expressed understanding and agreed to proceed.   History of Present Illness:  Started yesterday evening, with dry cough, drank lemon tea with honey and that has already helped  He used CBD oil that was old, felt liqid caused irritation to his throat and cough started afterwards No SOB, no fever, no body aches, no sore throat  This morning sent home from work due to cough   No vomiting or nausea, no sinus drainage or allergies  Temp 97.54F at home   [employer]  in [place]      Observations/Objective: NAD, no cough noted on phone, normal WOB, no sound to voice   Assessment and Plan: Cough- new onset, may have been due to the irritation with the old CBD oil, but in setting of COVID, need to monitor at home and make sure he does not develop any symptoms His cough started on Thursday, thus far no fever, feels well Will take out of work and f/u vis phone on Tuesday morning. If cough improved, no new symptoms no fever, can release back to work  Letter written for his employer  Follow Up Instructions:  Tuesday Televisit     I discussed the assessment and treatment plan with the patient. The patient was provided an opportunity to ask questions and all were answered. The patient agreed with the plan and demonstrated an understanding of the instructions.    The patient was advised to call back or seek an in-person evaluation if the symptoms worsen or if the condition fails to improve as anticipated.  I provided  9 minutes of non-face-to-face time during this encounter. End time 3:18    Vic Blackbird, MD

## 2018-08-31 ENCOUNTER — Encounter: Payer: Self-pay | Admitting: Family Medicine

## 2018-09-02 ENCOUNTER — Ambulatory Visit: Payer: BLUE CROSS/BLUE SHIELD | Admitting: Family Medicine

## 2018-09-02 ENCOUNTER — Telehealth: Payer: Self-pay | Admitting: Family Medicine

## 2018-09-02 ENCOUNTER — Other Ambulatory Visit: Payer: Self-pay

## 2018-09-02 NOTE — Telephone Encounter (Signed)
I spoke with patient, his cough resolved on Friday. He did turn in letter, but job allowed him to come back without any further docmentation No fever, no URi symptoms Advised to call back with any concerns

## 2019-05-04 IMAGING — MR MR LUMBAR SPINE W/O CM
5 series · 42 of 48 positions shown · non-contrast
Comparison: Prior radiograph from 04/23/2017

CLINICAL DATA: Initial evaluation for left leg numbness, with pain
in right knee.

EXAM:
MRI LUMBAR SPINE WITHOUT CONTRAST
TECHNIQUE: Multiplanar, multisequence MR imaging of the lumbar spine was
performed. No intravenous contrast was administered.

[Series 3: T2 post-contrast · sagittal · 4.0mm · 0.88mm/px · 5 of 14 slices shown]
[im 1/14]
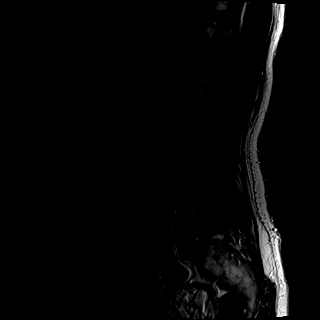
[im 4/14]
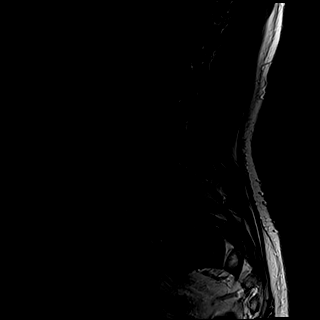
[im 7/14]
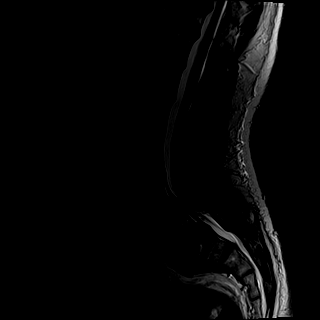
[im 10/14]
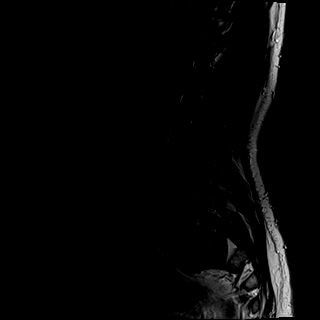
[im 14/14]
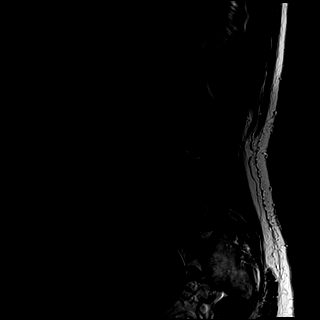

[Series 4: T1 · sagittal · 4.0mm · 1.09mm/px · 5 of 14 slices shown (1 of 2)]
[im 1/14]
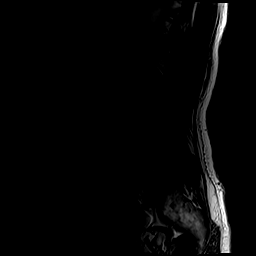
[im 4/14]
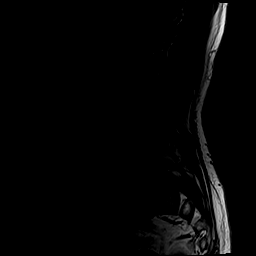
[im 7/14]
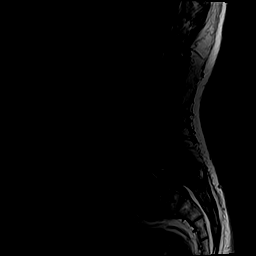
[im 10/14]
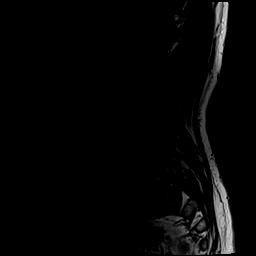
[im 14/14]
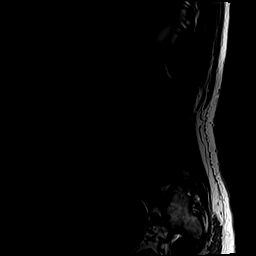

[Series 5: tirm sag · sagittal · 4.0mm · 0.55mm/px · 6 of 14 slices shown]
[im 1/14]
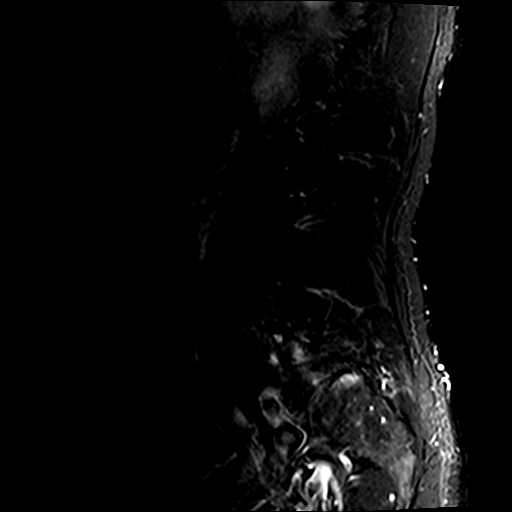
[im 3/14]
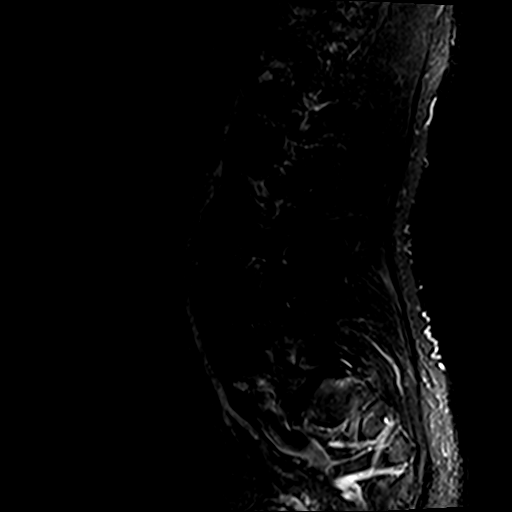
[im 6/14]
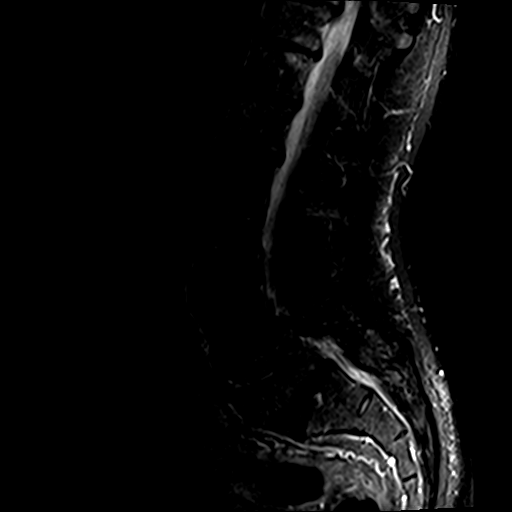
[im 8/14]
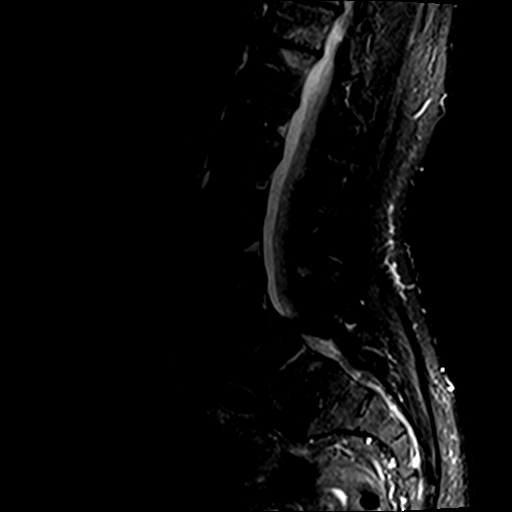
[im 11/14]
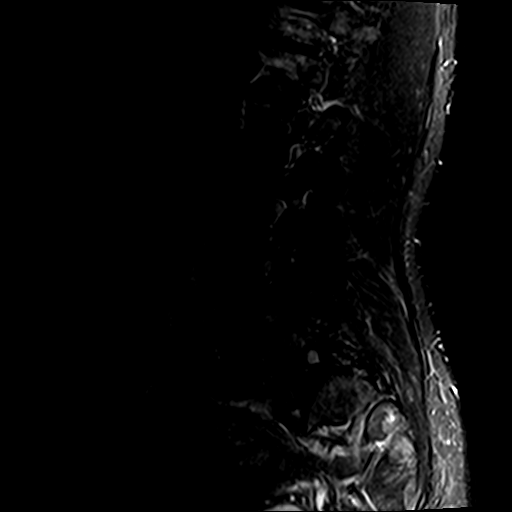
[im 14/14]
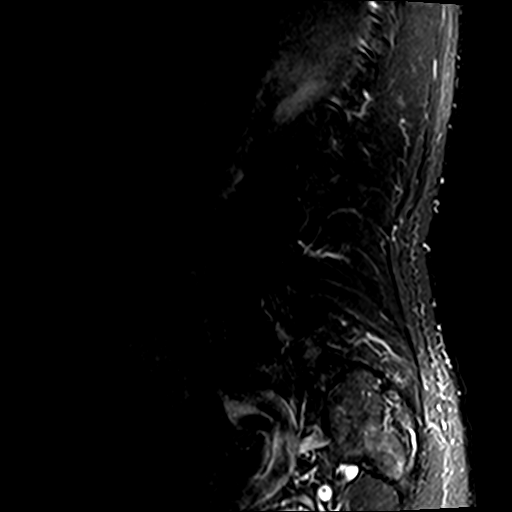

[Series 6: T1 · axial · 4.0mm · 0.78mm/px · z∈[-77,+137]mm · 10 of 39 slices shown (2 of 2)]
[im 3/39]
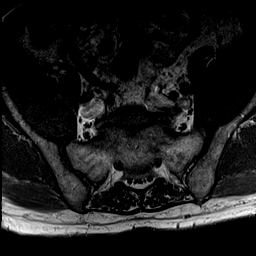
[im 6/39]
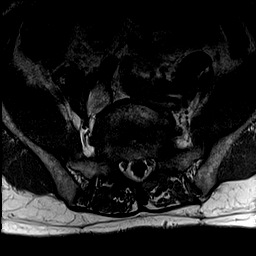
[im 8/39]
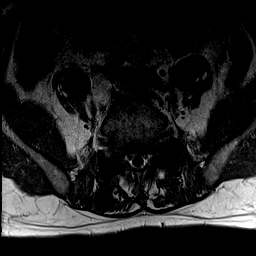
[im 13/39]
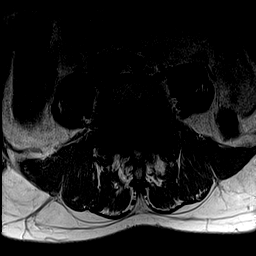
[im 18/39]
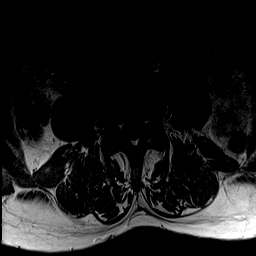
[im 21/39]
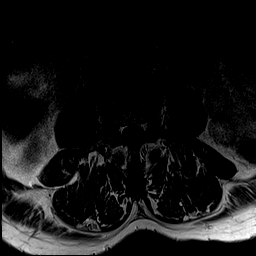
[im 23/39]
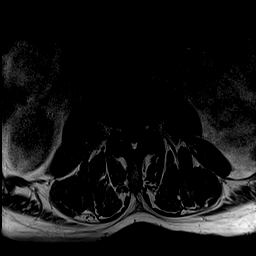
[im 28/39]
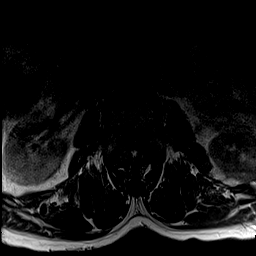
[im 33/39]
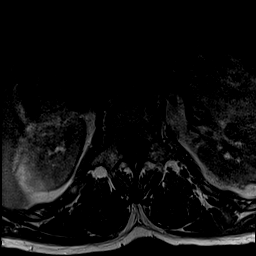
[im 39/39]
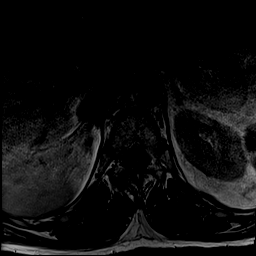

[Series 7: T2 · axial · 4.0mm · 0.78mm/px · z∈[-87,+137]mm · 16 of 39 slices shown]
[im 1/39]
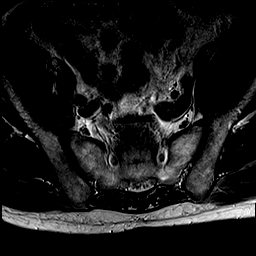
[im 3/39]
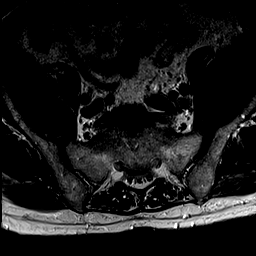
[im 6/39]
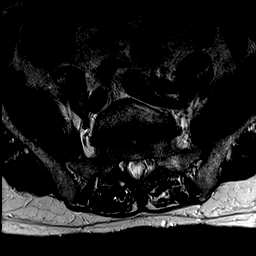
[im 8/39]
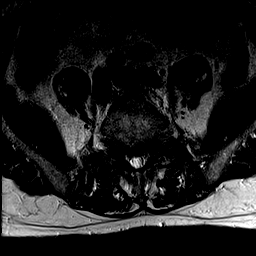
[im 11/39]
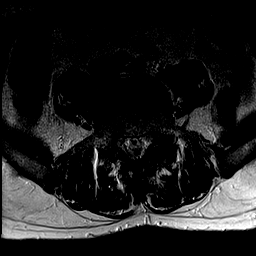
[im 13/39]
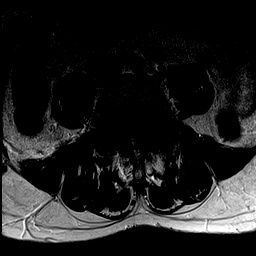
[im 16/39]
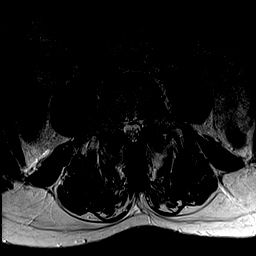
[im 18/39]
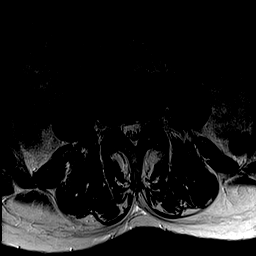
[im 21/39]
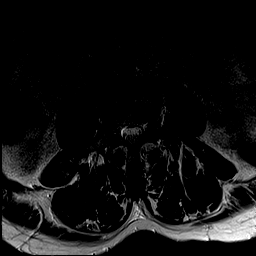
[im 23/39]
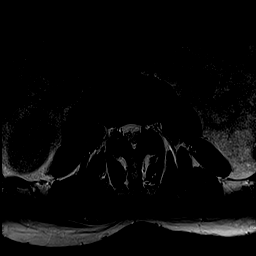
[im 26/39]
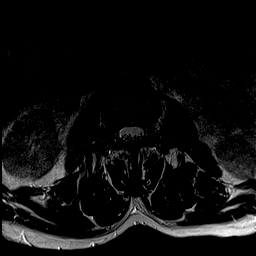
[im 28/39]
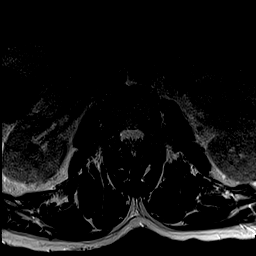
[im 31/39]
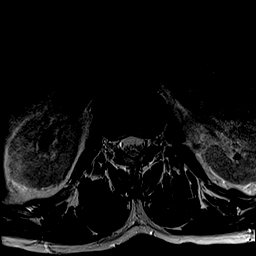
[im 33/39]
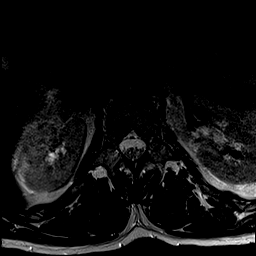
[im 36/39]
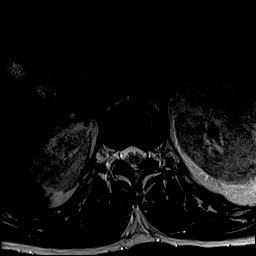
[im 39/39]
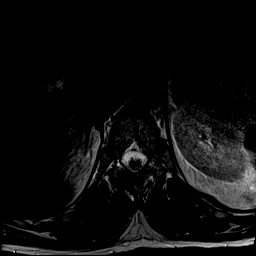

[42 of 48 positions shown; findings below may reference images not displayed]

FINDINGS: Segmentation: Standard. Lowest well-formed disc labeled the L5-S1
level.

Alignment: 7 mm anterolisthesis of L4 on L5, chronic and facet
mediated. Trace retrolisthesis of L1 on L2. Alignment otherwise
normal with preservation of the normal lumbar lordosis.

Vertebrae: Vertebral body heights maintained without evidence for
acute or chronic fracture. Bone marrow signal intensity mildly
heterogeneous without discrete or worrisome osseous lesions.
Reactive endplate changes noted about the T11-12 interspace. Mild
marrow edema about the L4-5 and/or L5-S1 facets due to facet
arthritis. No other abnormal marrow edema.

Conus medullaris and cauda equina: Conus extends to the L1 level.
Conus and cauda equina appear normal.

Paraspinal and other soft tissues: Paraspinous soft tissues within
normal limits. Visualized visceral structures unremarkable.

Disc levels:

T10-11: Seen only on sagittal projection. Diffuse disc bulge with
disc desiccation and intervertebral disc space narrowing. Bilateral
facet hypertrophy. No significant spinal stenosis. Suspected mild to
moderate bilateral foraminal narrowing.

T11-12: Seen only on sagittal projection. Diffuse disc bulge with
disc desiccation and intervertebral disc space narrowing. Moderate
bilateral facet hypertrophy. No significant spinal stenosis.
Moderate bilateral foraminal narrowing.

T12-L1: Mild diffuse disc bulge.  No significant stenosis.

L1-2: Mild diffuse disc bulge with disc desiccation. Mild facet
hypertrophy bilaterally. Trace joint effusion on the right. No
significant stenosis.

L2-3: Disc desiccation without significant disc bulge. Mild facet
hypertrophy. No significant stenosis.

L3-4: Mild diffuse disc bulge. Mild facet and ligament flavum
hypertrophy. Resultant mild left lateral recess narrowing without
significant spinal stenosis. Mild bilateral L3 foraminal narrowing,
right greater than left.

L4-5: 7 mm anterolisthesis, chronic and facet mediated. Associated
broad posterior pseudo disc bulge/uncovering. Severe facet arthrosis
bilaterally. Resultant severe canal and bilateral subarticular
stenosis. Thecal sac measures approximately 6 mm in AP diameter.
Moderate to severe bilateral L4 foraminal stenosis, right worse than
left.

L5-S1: Trace anterolisthesis. Disc desiccation with mild diffuse
disc bulge. Advanced facet and ligament flavum hypertrophy. Central
canal remains widely patent. Moderate bilateral L5 foraminal
stenosis, left slightly worse than right.
IMPRESSION: 1. Multifactorial degenerative changes at L4-5 with resultant severe
canal with moderate to severe right greater than left L4 foraminal
stenosis.
2. Disc bulging with advanced facet hypertrophy at L5-S1 with
resultant moderate bilateral L5 foraminal stenosis, left greater
than right.
3. Disc bulging with facet hypertrophy at L3-4 with resultant mild
left lateral recess stenosis, with mild bilateral L3 foraminal
narrowing.

## 2020-01-12 ENCOUNTER — Ambulatory Visit (INDEPENDENT_AMBULATORY_CARE_PROVIDER_SITE_OTHER): Payer: BC Managed Care – PPO | Admitting: Family Medicine

## 2020-01-12 ENCOUNTER — Encounter: Payer: Self-pay | Admitting: Family Medicine

## 2020-01-12 ENCOUNTER — Other Ambulatory Visit: Payer: Self-pay

## 2020-01-12 VITALS — BP 110/68 | HR 66 | Temp 98.0°F | Resp 14 | Ht 69.0 in | Wt 170.0 lb

## 2020-01-12 DIAGNOSIS — Z1211 Encounter for screening for malignant neoplasm of colon: Secondary | ICD-10-CM | POA: Diagnosis not present

## 2020-01-12 DIAGNOSIS — W57XXXA Bitten or stung by nonvenomous insect and other nonvenomous arthropods, initial encounter: Secondary | ICD-10-CM

## 2020-01-12 DIAGNOSIS — S0086XA Insect bite (nonvenomous) of other part of head, initial encounter: Secondary | ICD-10-CM | POA: Diagnosis not present

## 2020-01-12 DIAGNOSIS — L03012 Cellulitis of left finger: Secondary | ICD-10-CM

## 2020-01-12 MED ORDER — METHYLPREDNISOLONE ACETATE 40 MG/ML IJ SUSP
40.0000 mg | Freq: Once | INTRAMUSCULAR | Status: AC
  Administered 2020-01-12: 40 mg via INTRAMUSCULAR

## 2020-01-12 MED ORDER — SULFAMETHOXAZOLE-TRIMETHOPRIM 800-160 MG PO TABS: 1.0000 | ORAL_TABLET | Freq: Two times a day (BID) | ORAL | 0 refills | Status: DC

## 2020-01-12 NOTE — Progress Notes (Signed)
   Subjective:    Patient ID: Thomas Mcdonald, male    DOB: July 28, 1960, 59 y.o.   MRN: [id number]  Patient presents for Insect Bite (x2 days- forehead, L hand)  Pt here with insect bite  No current prescription meds    Sunday night he went outside to get something and when he came in felt a stinging on his forehead.  He then noticed a spot on his hands.  He did not see the actual insect or bug.  He then had subsequent swelling over the next 24 hours.  He has not had any fever chills no body aches no joint pain.  There is been no pus but the one on his left finger index appears to have a blister.  He has not had any streaking.  He is able to make a loose fist secondary to the swelling.  He does have tenderness around the left finger.  He did use topical Benadryl as well as oral Benadryl and use ice pack which helped the swelling compared to yesterday.  TDAP UTD 2018   Review Of Systems:  GEN- denies fatigue, fever, weight loss,weakness, recent illness HEENT- denies eye drainage, change in vision, nasal discharge, CVS- denies chest pain, palpitations RESP- denies SOB, cough, wheeze ABD- denies N/V, change in stools, abd pain GU- denies dysuria, hematuria, dribbling, incontinence MSK- + joint pain, muscle aches, injury Neuro- denies headache, dizziness, syncope, seizure activity       Objective:    BP 110/68   Pulse 66   Temp 98 F (36.7 C) (Temporal)   Resp 14   Ht 5\' 9"  (1.753 m)   Wt 170 lb (77.1 kg)   SpO2 98%   BMI 25.10 kg/m  GEN- NAD, alert and oriented x3 HEENT- PERRL, EOMI, non injected sclera, pink conjunctiva, center of forehead small insect bite with swelling surronding approx 1 inch wide, NT, no fluctance Neck- Supple, no LAD  CVS- RRR, no murmur RESP-CTAB EXT- No edema , bite right lower leg with mild erythema, mild TTP  Left hand swelling of thumb, index finger and mid hand, no lesions on palm, index finger with blister and erythema surrounding, able to make  loose fist, finger joints NT  Pulses- Radial 2+        Assessment & Plan:      Problem List Items Addressed This Visit    None    Visit Diagnoses    Insect bite of forehead, initial encounter    -  Primary   Multiple bites, unclear cause, no streaking or tissue necrosis seen, recommend he not unroof the blister. 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 Any transcriptional errors that result from this process are unintentional.

## 2020-01-12 NOTE — Patient Instructions (Signed)
Schedule a physical Steroid shot given Take antibiotics Continue benadryl and ice pack Referral to GI for colonoscopy

## 2020-01-12 NOTE — Addendum Note (Signed)
Addended by: Sheral Flow on: 01/12/2020 03:08 PM   Modules accepted: Orders

## 2020-06-23 ENCOUNTER — Ambulatory Visit (AMBULATORY_SURGERY_CENTER): Payer: Self-pay

## 2020-06-23 ENCOUNTER — Other Ambulatory Visit: Payer: Self-pay

## 2020-06-23 VITALS — Ht 69.0 in | Wt 172.0 lb

## 2020-06-23 DIAGNOSIS — Z1211 Encounter for screening for malignant neoplasm of colon: Secondary | ICD-10-CM

## 2020-06-23 MED ORDER — SUTAB 1479-225-188 MG PO TABS: 12.0000 | ORAL_TABLET | ORAL | 0 refills | Status: DC

## 2020-06-23 NOTE — Progress Notes (Signed)
No egg or soy allergy known to patient  No issues with past sedation with any surgeries or procedures Patient denies ever being told they had issues or difficulty with intubation  No FH of Malignant Hyperthermia No diet pills per patient No home 02 use per patient  No blood thinners per patient  Pt denies issues with constipation  No A fib or A flutter  EMMI video to pt or via MyChart  COVID 19 guidelines implemented in PV today with Pt and RN  Pt is fully vaccinated  for Covid   sutab  Coupon given to pt in PV today , Code to Pharmacy and  NO PA's for preps discussed with pt In PV today  Discussed with pt there will be an out-of-pocket cost for prep and that varies from $0 to 70 dollars   Due to the COVID-19 pandemic we are asking patients to follow certain guidelines.  Pt aware of COVID protocols and [facility] guidelines   

## 2020-07-07 ENCOUNTER — Encounter: Payer: Self-pay | Admitting: Internal Medicine

## 2020-07-08 ENCOUNTER — Ambulatory Visit (AMBULATORY_SURGERY_CENTER): Payer: BC Managed Care – PPO | Admitting: Internal Medicine

## 2020-07-08 ENCOUNTER — Encounter: Payer: Self-pay | Admitting: Internal Medicine

## 2020-07-08 ENCOUNTER — Other Ambulatory Visit: Payer: Self-pay

## 2020-07-08 VITALS — BP 115/65 | HR 67 | Temp 97.5°F | Resp 19 | Ht 69.0 in | Wt 172.0 lb

## 2020-07-08 DIAGNOSIS — D122 Benign neoplasm of ascending colon: Secondary | ICD-10-CM

## 2020-07-08 DIAGNOSIS — D120 Benign neoplasm of cecum: Secondary | ICD-10-CM

## 2020-07-08 DIAGNOSIS — D125 Benign neoplasm of sigmoid colon: Secondary | ICD-10-CM | POA: Diagnosis not present

## 2020-07-08 DIAGNOSIS — Z1211 Encounter for screening for malignant neoplasm of colon: Secondary | ICD-10-CM | POA: Diagnosis present

## 2020-07-08 DIAGNOSIS — D123 Benign neoplasm of transverse colon: Secondary | ICD-10-CM | POA: Diagnosis not present

## 2020-07-08 MED ORDER — SODIUM CHLORIDE 0.9 % IV SOLN: 500.0000 mL | Freq: Once | INTRAVENOUS | Status: DC

## 2020-07-08 NOTE — Op Note (Signed)
[facility] Patient Name: Thomas Mcdonald Procedure Date: 07/08/2020 8:35 AM MRN: [id number] Endoscopist: Jerene Bears , MD Age: 60 Referring MD:  Date of Birth: 1960/08/03 Gender: Male Account #: [id number] Procedure:                Colonoscopy Indications:              Screening for colorectal malignant neoplasm, This                            is the patient's first colonoscopy Medicines:                Monitored Anesthesia Care Procedure:                Pre-Anesthesia Assessment:                           - Prior to the procedure, a History and Physical                            was performed, and patient medications and                            allergies were reviewed. The patient's tolerance of                            previous anesthesia was also reviewed. The risks                            and benefits of the procedure and the sedation                            options and risks were discussed with the patient.                            All questions were answered, and informed consent                            was obtained. Prior Anticoagulants: The patient has                            taken no previous anticoagulant or antiplatelet                            agents. ASA Grade Assessment: II - A patient with                            mild systemic disease. After reviewing the risks                            and benefits, the patient was deemed in                            satisfactory condition to undergo the procedure.  After obtaining informed consent, the colonoscope                            was passed under direct vision. Throughout the                            procedure, the patient's blood pressure, pulse, and                            oxygen saturations were monitored continuously. The                            Olympus CF-HQ190L [id number]) Colonoscope was                            introduced through the anus and  advanced to the                            cecum, identified by appendiceal orifice and                            ileocecal valve. The colonoscopy was performed                            without difficulty. The patient tolerated the                            procedure well. The quality of the bowel                            preparation was good. The ileocecal valve,                            appendiceal orifice, and rectum were photographed. Scope In: 8:42:05 AM Scope Out: 8:59:12 AM Scope Withdrawal Time: 0 hours 13 minutes 40 seconds  Total Procedure Duration: 0 hours 17 minutes 7 seconds  Findings:                 The digital rectal exam was normal.                           A 3 mm polyp was found in the cecum. The polyp was                            sessile. The polyp was removed with a cold snare.                            Resection and retrieval were complete.                           Two sessile polyps were found in the ascending                            colon. The polyps were 5 to 6 mm  in size. These                            polyps were removed with a cold snare. Resection                            and retrieval were complete.                           A 4 mm polyp was found in the transverse colon. The                            polyp was sessile. The polyp was removed with a                            cold snare. Resection and retrieval were complete.                           A 4 mm polyp was found in the sigmoid colon. The                            polyp was sessile. The polyp was removed with a                            cold snare. Resection and retrieval were complete.                           Multiple small-mouthed diverticula were found in                            the sigmoid colon, descending colon, ascending                            colon and cecum.                           The retroflexed view of the distal rectum and anal                             verge was normal and showed no anal or rectal                            abnormalities. Complications:            No immediate complications. Estimated Blood Loss:     Estimated blood loss was minimal. Impression:               - One 3 mm polyp in the cecum, removed with a cold                            snare. Resected and retrieved.                           - Two 5 to 6 mm polyps in the ascending colon,  removed with a cold snare. Resected and retrieved.                           - One 4 mm polyp in the transverse colon, removed                            with a cold snare. Resected and retrieved.                           - One 4 mm polyp in the sigmoid colon, removed with                            a cold snare. Resected and retrieved.                           - Diverticulosis in the sigmoid colon, in the                            descending colon, in the ascending colon and in the                            cecum.                           - The distal rectum and anal verge are normal on                            retroflexion view. Recommendation:           - Patient has a contact number available for                            emergencies. The signs and symptoms of potential                            delayed complications were discussed with the                            patient. Return to normal activities tomorrow.                            Written discharge instructions were provided to the                            patient.                           - Resume previous diet.                           - Continue present medications.                           - Await pathology results.                           -  Repeat colonoscopy is recommended. The                            colonoscopy date will be determined after pathology                            results from today's exam become available for                            review. Jerene Bears, MD 07/08/2020 9:02:29 AM This report has been signed electronically.

## 2020-07-08 NOTE — Patient Instructions (Signed)
Handout on polyps and diverticulosis given    YOU HAD AN ENDOSCOPIC PROCEDURE TODAY AT THE [facility]:   Refer to the procedure report that was given to you for any specific questions about what was found during the examination.  If the procedure report does not answer your questions, please call your gastroenterologist to clarify.  If you requested that your care partner not be given the details of your procedure findings, then the procedure report has been included in a sealed envelope for you to review at your convenience later.  YOU SHOULD EXPECT: Some feelings of bloating in the abdomen. Passage of more gas than usual.  Walking can help get rid of the air that was put into your GI tract during the procedure and reduce the bloating. If you had a lower endoscopy (such as a colonoscopy or flexible sigmoidoscopy) you may notice spotting of blood in your stool or on the toilet paper. If you underwent a bowel prep for your procedure, you may not have a normal bowel movement for a few days.  Please Note:  You might notice some irritation and congestion in your nose or some drainage.  This is from the oxygen used during your procedure.  There is no need for concern and it should clear up in a day or so.  SYMPTOMS TO REPORT IMMEDIATELY:   Following lower endoscopy (colonoscopy or flexible sigmoidoscopy):  Excessive amounts of blood in the stool  Significant tenderness or worsening of abdominal pains  Swelling of the abdomen that is new, acute  Fever of 100F or higher   For urgent or emergent issues, a gastroenterologist can be reached at any hour by calling [phone]. Do not use MyChart messaging for urgent concerns.    DIET:  We do recommend a small meal at first, but then you may proceed to your regular diet.  Drink plenty of fluids but you should avoid alcoholic beverages for 24 hours.  ACTIVITY:  You should plan to take it easy for the rest of today and you should NOT  DRIVE or use heavy machinery until tomorrow (because of the sedation medicines used during the test).    FOLLOW UP: Our staff will call the number listed on your records 48-72 hours following your procedure to check on you and address any questions or concerns that you may have regarding the information given to you following your procedure. If we do not reach you, we will leave a message.  We will attempt to reach you two times.  During this call, we will ask if you have developed any symptoms of COVID 19. If you develop any symptoms (ie: fever, flu-like symptoms, shortness of breath, cough etc.) before then, please call [phone].  If you test positive for Covid 19 in the 2 weeks post procedure, please call and report this information to us.    If any biopsies were taken you will be contacted by phone or by letter within the next 1-3 weeks.  Please call us at [phone] if you have not heard about the biopsies in 3 weeks.    SIGNATURES/CONFIDENTIALITY: You and/or your care partner have signed paperwork which will be entered into your electronic medical record.  These signatures attest to the fact that that the information above on your After Visit Summary has been reviewed and is understood.  Full responsibility of the confidentiality of this discharge information lies with you and/or your care-partner. 

## 2020-07-08 NOTE — Progress Notes (Signed)
Called to room to assist during endoscopic procedure.  Patient ID and intended procedure confirmed with present staff. Received instructions for my participation in the procedure from the performing physician.  

## 2020-07-08 NOTE — Progress Notes (Signed)
A and O x3. Report to RN. Tolerated MAC anesthesia well.

## 2020-07-08 NOTE — Progress Notes (Signed)
Medical history reviewed. VS assessed by C.W 

## 2020-07-12 ENCOUNTER — Telehealth: Payer: Self-pay | Admitting: *Deleted

## 2020-07-12 ENCOUNTER — Telehealth: Payer: Self-pay

## 2020-07-12 NOTE — Telephone Encounter (Signed)
Patients wife called back states he is well no questions at this time.

## 2020-07-12 NOTE — Telephone Encounter (Signed)
Second follow up call and pt wife calls and state pt is doing fine, no problems, feeling well and no covid sx

## 2020-07-12 NOTE — Telephone Encounter (Signed)
Attempted f/u phone call. No answer. Left message. °

## 2020-07-18 ENCOUNTER — Encounter: Payer: Self-pay | Admitting: Internal Medicine

## 2020-10-20 ENCOUNTER — Telehealth: Payer: Self-pay

## 2020-10-20 NOTE — Telephone Encounter (Signed)
This patient's spouse is a patient of Dr. Samella Parr and wants to know if he will accept him as one of his patients. He's a former patient of Dr. Dorian Heckle. She's aware provider has put a pause on accepting additional patients. Please advise at [phone].

## 2020-10-24 ENCOUNTER — Telehealth: Payer: Self-pay | Admitting: Nurse Practitioner

## 2020-10-24 NOTE — Telephone Encounter (Signed)
Left message on vmail to schedule cpe/reestablish care.

## 2020-11-10 NOTE — Progress Notes (Signed)
BP 108/78   Pulse 82   Temp 98.3 F (36.8 C)   Ht '5\' 9"'$  (1.753 m)   Wt 169 lb 9.6 oz (76.9 kg)   SpO2 98%   BMI 25.05 kg/m    Subjective:    Patient ID: Thomas Mcdonald, male    DOB: [birth year], 60 y.o.   MRN: [id number]  HPI: Thomas Mcdonald is a 60 y.o. male presenting on 11/11/2020 for comprehensive medical examination. 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  Reports he has some sciatic pain in his left back that shoots down to his foot and makes the ball of his foot numb.  He does some stretches and this gets better.   Depression Screen done today and results listed below:  Depression screen [facility] 2/9 11/11/2020 06/11/2016  Decreased Interest 0 0  Down, Depressed, Hopeless 0 0  PHQ - 2 Score 0 0  Altered sleeping 0 0  Tired, decreased energy 0 0  Change in appetite 0 0  Feeling bad or failure about yourself  0 0  Trouble concentrating 0 0  Moving slowly or fidgety/restless 0 0  Suicidal thoughts 0 3  PHQ-9 Score 0 3  Difficult doing work/chores Not difficult at all Not difficult at all   The patient does not have a history of falls. I did not complete a risk assessment for falls. A plan of care for falls was not documented.   Past Medical History:  Past Medical History:  Diagnosis Date   Asthma    no inhaler    Surgical History:  Past Surgical History:  Procedure Laterality Date   KNEE ARTHROSCOPY WITH MEDIAL MENISECTOMY Right 06/25/2018   Procedure: RIGHT KNEE ARTHROSCOPY WITH PARTIAL MEDIAL MENISCECTOMY;  Surgeon: Leandrew Koyanagi, MD;  Location: [facility];  Service: Orthopedics;  Laterality: Right;   NO PAST SURGERIES     SYNOVECTOMY Right 06/25/2018   Procedure: SYNOVECTOMY;  Surgeon: Leandrew Koyanagi, MD;  Location: [facility];  Service: Orthopedics;  Laterality: Right;    Medications:  Current Outpatient Medications on File  Prior to Visit  Medication Sig   Acetaminophen (TYLENOL ARTHRITIS EXT RELIEF PO) Take by mouth.   Multiple Vitamin (MULTIVITAMIN WITH MINERALS) TABS tablet Take 1 tablet by mouth daily.   No current facility-administered medications on file prior to visit.    Allergies:  Allergies  Allergen Reactions   Shellfish Allergy Hives    Social History:  Social History   Socioeconomic History   Marital status: Single    Spouse name: Not on file   Number of children: Not on file   Years of education: Not on file   Highest education level: Not on file  Occupational History   Not on file  Tobacco Use   Smoking status: Every Day    Packs/day: 0.50    Types: Cigarettes   Smokeless tobacco: Never  Vaping Use   Vaping Use: Never used  Substance and Sexual Activity   Alcohol use: Yes    Alcohol/week: 21.0 standard drinks    Types: 21 Cans of beer per week    Comment: wife describes drinking a moderate amt daily   Drug use: No    Comment: H/O drug abuse. Prefers minimal narcotics   Sexual activity: Yes    Partners: Female  Other Topics Concern   Not on file  Social History Narrative  Not on file   Social Determinants of Health   Financial Resource Strain: Not on file  Food Insecurity: Not on file  Transportation Needs: Not on file  Physical Activity: Not on file  Stress: Not on file  Social Connections: Not on file  Intimate Partner Violence: Not on file   Social History   Tobacco Use  Smoking Status Every Day   Packs/day: 0.50   Types: Cigarettes  Smokeless Tobacco Never   Social History   Substance and Sexual Activity  Alcohol Use Yes   Alcohol/week: 21.0 standard drinks   Types: 21 Cans of beer per week   Comment: wife describes drinking a moderate amt daily    Family History:  Family History  Problem Relation Age of Onset   Cancer Mother    Heart disease Mother    Heart disease Father    Colon cancer Neg Hx    Colon polyps Neg Hx    Esophageal  cancer Neg Hx    Rectal cancer Neg Hx    Stomach cancer Neg Hx     Past medical history, surgical history, medications, allergies, family history and social history reviewed with patient today and changes made to appropriate areas of the chart.   Review of Systems  Constitutional: Negative.   HENT: Negative.    Eyes: Negative.   Respiratory: Negative.    Cardiovascular: Negative.   Gastrointestinal: Negative.   Genitourinary: Negative.   Musculoskeletal: Negative.   Skin: Negative.   Neurological: Negative.   Psychiatric/Behavioral: Negative.       Objective:    BP 108/78   Pulse 82   Temp 98.3 F (36.8 C)   Ht '5\' 9"'$  (1.753 m)   Wt 169 lb 9.6 oz (76.9 kg)   SpO2 98%   BMI 25.05 kg/m   Wt Readings from Last 3 Encounters:  11/11/20 169 lb 9.6 oz (76.9 kg)  07/08/20 172 lb (78 kg)  06/23/20 172 lb (78 kg)    Physical Exam Vitals and nursing note reviewed.  Constitutional:      General: He is not in acute distress.    Appearance: Normal appearance. He is normal weight. He is not toxic-appearing.  HENT:     Head: Normocephalic and atraumatic.     Right Ear: Tympanic membrane, ear canal and external ear normal.     Left Ear: Tympanic membrane, ear canal and external ear normal.     Nose: Nose normal. No congestion.     Mouth/Throat:     Mouth: Mucous membranes are moist.     Pharynx: Oropharynx is clear. No oropharyngeal exudate or posterior oropharyngeal erythema.  Eyes:     General: No scleral icterus.    Extraocular Movements: Extraocular movements intact.     Conjunctiva/sclera: Conjunctivae normal.     Pupils: Pupils are equal, round, and reactive to light.  Neck:     Vascular: No carotid bruit.  Cardiovascular:     Rate and Rhythm: Normal rate and regular rhythm.     Heart sounds: Normal heart sounds. No murmur heard.   No gallop.  Pulmonary:     Effort: Pulmonary effort is normal. No respiratory distress.     Breath sounds: Normal breath sounds. No  wheezing or rhonchi.  Abdominal:     General: Abdomen is flat. Bowel sounds are normal. There is no distension.     Palpations: Abdomen is soft.     Tenderness: There is no abdominal tenderness.  Genitourinary:    Comments:  Deferred using shared decision making Musculoskeletal:        General: No swelling or tenderness. Normal range of motion.     Cervical back: Normal range of motion and neck supple. No tenderness.     Right lower leg: No edema.     Left lower leg: No edema.  Skin:    General: Skin is warm and dry.     Capillary Refill: Capillary refill takes less than 2 seconds.     Coloration: Skin is not jaundiced or pale.     Findings: No lesion.  Neurological:     General: No focal deficit present.     Mental Status: He is alert and oriented to person, place, and time. Mental status is at baseline.     Motor: No weakness.     Gait: Gait normal.  Psychiatric:        Mood and Affect: Mood normal.        Behavior: Behavior normal.        Thought Content: Thought content normal.        Judgment: Judgment normal.       Assessment & Plan:   Problem List Items Addressed This Visit   None Visit Diagnoses     Annual physical exam    -  Primary   Encounter for lipid screening for cardiovascular disease       Relevant Orders   Lipid panel   Screening for deficiency anemia       Relevant Orders   CBC with Differential/Platelet   Screening for metabolic disorder       Relevant Orders   COMPLETE METABOLIC PANEL WITH GFR   Screening for prostate cancer       Relevant Orders   PSA   Left sciatic nerve pain       Stable with stretches.  Encouraged patient to reach out if pain worsens or persists despite stretching.   Encounter for smoking cessation counseling       Discussed cutting back to quit method - picking stop date and counting back weeks by number of cigarettes.  He is going to try.        Discussed aspirin prophylaxis for myocardial infarction prevention and  decision was it was not indicated  LABORATORY TESTING:  Health maintenance labs ordered today as discussed above.   The natural history of prostate cancer and ongoing controversy regarding screening and potential treatment outcomes of prostate cancer has been discussed with the patient. The meaning of a false positive PSA and a false negative PSA has been discussed. He indicates understanding of the limitations of this screening test and wishes to proceed with screening PSA testing.   IMMUNIZATIONS:   - Tdap: Tetanus vaccination status reviewed: last tetanus booster within 10 years. - Influenza: Postponed to flu season - Pneumovax:  he is going to think about getting - Prevnar: Not applicable - HPV: Not applicable - Zostavax vaccine: Refused - COVID-19 vaccine: Reports he has had 2 doses; encouraged booster  SCREENING: - Colonoscopy: Up to date  Discussed with patient purpose of the colonoscopy is to detect colon cancer at curable precancerous or early stages   - AAA Screening: Not applicable  -Hearing Test: Not applicable  -Spirometry: Not applicable   PATIENT COUNSELING:    Sexuality: Discussed sexually transmitted diseases, partner selection, use of condoms, avoidance of unintended pregnancy  and contraceptive alternatives.   Advised to avoid cigarette smoking.  I discussed with the patient that most people either  abstain from alcohol or drink within safe limits (<=14/week and <=4 drinks/occasion for males, <=7/weeks and <= 3 drinks/occasion for females) and that the risk for alcohol disorders and other health effects rises proportionally with the number of drinks per week and how often a drinker exceeds daily limits.  Discussed cessation/primary prevention of drug use and availability of treatment for abuse.   Diet: Encouraged to adjust caloric intake to maintain  or achieve ideal body weight, to reduce intake of dietary saturated fat and total fat, to limit sodium intake by  avoiding high sodium foods and not adding table salt, and to maintain adequate dietary potassium and calcium preferably from fresh fruits, vegetables, and low-fat dairy products.    stressed the importance of regular exercise  Injury prevention: Discussed safety belts, safety helmets, smoke detector, smoking near bedding or upholstery.   Dental health: Discussed importance of regular tooth brushing, flossing, and dental visits.   Follow up plan: NEXT PREVENTATIVE PHYSICAL DUE IN 1 YEAR. Return for pending lab work.

## 2020-11-11 ENCOUNTER — Other Ambulatory Visit: Payer: Self-pay

## 2020-11-11 ENCOUNTER — Encounter: Payer: Self-pay | Admitting: Nurse Practitioner

## 2020-11-11 ENCOUNTER — Ambulatory Visit (INDEPENDENT_AMBULATORY_CARE_PROVIDER_SITE_OTHER): Payer: BC Managed Care – PPO | Admitting: Nurse Practitioner

## 2020-11-11 VITALS — BP 108/78 | HR 82 | Temp 98.3°F | Ht 69.0 in | Wt 169.6 lb

## 2020-11-11 DIAGNOSIS — Z131 Encounter for screening for diabetes mellitus: Secondary | ICD-10-CM

## 2020-11-11 DIAGNOSIS — Z13220 Encounter for screening for lipoid disorders: Secondary | ICD-10-CM | POA: Diagnosis not present

## 2020-11-11 DIAGNOSIS — Z130 Encounter for screening for diseases of the blood and blood-forming organs and certain disorders involving the immune mechanism: Secondary | ICD-10-CM | POA: Diagnosis not present

## 2020-11-11 DIAGNOSIS — Z13228 Encounter for screening for other metabolic disorders: Secondary | ICD-10-CM | POA: Diagnosis not present

## 2020-11-11 DIAGNOSIS — Z125 Encounter for screening for malignant neoplasm of prostate: Secondary | ICD-10-CM

## 2020-11-11 DIAGNOSIS — Z0000 Encounter for general adult medical examination without abnormal findings: Secondary | ICD-10-CM

## 2020-11-11 DIAGNOSIS — Z0001 Encounter for general adult medical examination with abnormal findings: Secondary | ICD-10-CM | POA: Diagnosis not present

## 2020-11-11 DIAGNOSIS — M5432 Sciatica, left side: Secondary | ICD-10-CM

## 2020-11-11 DIAGNOSIS — Z716 Tobacco abuse counseling: Secondary | ICD-10-CM

## 2020-11-11 DIAGNOSIS — Z136 Encounter for screening for cardiovascular disorders: Secondary | ICD-10-CM

## 2020-11-12 LAB — LIPID PANEL
Cholesterol: 167 mg/dL (ref <200)
HDL: 63 mg/dL (ref >=40)
LDL Cholesterol (Calc): 90 mg/dL (calc)
Non-HDL Cholesterol (Calc): 104 mg/dL (calc) (ref <130)
Total CHOL/HDL Ratio: 2.7 (calc) (ref <5.0)
Triglycerides: 58 mg/dL (ref <150)

## 2020-11-12 LAB — COMPLETE METABOLIC PANEL WITH GFR
AG Ratio: 2 (calc) (ref 1.0–2.5)
ALT: 22 U/L (ref 9–46)
AST: 26 U/L (ref 10–35)
Albumin: 4.3 g/dL (ref 3.6–5.1)
Alkaline phosphatase (APISO): 72 U/L (ref 35–144)
BUN: 11 mg/dL (ref 7–25)
CO2: 27 mmol/L (ref 20–32)
Calcium: 8.9 mg/dL (ref 8.6–10.3)
Chloride: 104 mmol/L (ref 98–110)
Creat: 1.05 mg/dL (ref 0.70–1.35)
Globulin: 2.2 g/dL (calc) (ref 1.9–3.7)
Glucose, Bld: 77 mg/dL (ref 65–99)
Potassium: 4.1 mmol/L (ref 3.5–5.3)
Sodium: 139 mmol/L (ref 135–146)
Total Bilirubin: 0.7 mg/dL (ref 0.2–1.2)
Total Protein: 6.5 g/dL (ref 6.1–8.1)
eGFR: 81 mL/min/{1.73_m2} (ref >=60)

## 2020-11-12 LAB — CBC WITH DIFFERENTIAL/PLATELET
Absolute Monocytes: 671 cells/uL (ref 200–950)
Basophils Absolute: 60 cells/uL (ref 0–200)
Basophils Relative: 0.7 %
Eosinophils Absolute: 396 cells/uL (ref 15–500)
Eosinophils Relative: 4.6 %
HCT: 41 % (ref 38.5–50.0)
Hemoglobin: 13.7 g/dL (ref 13.2–17.1)
Lymphs Abs: 2305 cells/uL (ref 850–3900)
MCH: 31.6 pg (ref 27.0–33.0)
MCHC: 33.4 g/dL (ref 32.0–36.0)
MCV: 94.5 fL (ref 80.0–100.0)
MPV: 10.4 fL (ref 7.5–12.5)
Monocytes Relative: 7.8 %
Neutro Abs: 5169 cells/uL (ref 1500–7800)
Neutrophils Relative %: 60.1 %
Platelets: 246 10*3/uL (ref 140–400)
RBC: 4.34 10*6/uL (ref 4.20–5.80)
RDW: 12.9 % (ref 11.0–15.0)
Total Lymphocyte: 26.8 %
WBC: 8.6 10*3/uL (ref 3.8–10.8)

## 2020-11-12 LAB — PSA: PSA: 2.92 ng/mL (ref <=4.00)

## 2020-11-17 NOTE — Progress Notes (Signed)
Spoke with the wife of pt regarding lab results

## 2020-11-22 ENCOUNTER — Other Ambulatory Visit: Payer: Self-pay

## 2020-11-22 ENCOUNTER — Ambulatory Visit (INDEPENDENT_AMBULATORY_CARE_PROVIDER_SITE_OTHER): Payer: BC Managed Care – PPO | Admitting: *Deleted

## 2020-11-22 DIAGNOSIS — Z23 Encounter for immunization: Secondary | ICD-10-CM | POA: Diagnosis not present

## 2020-12-16 ENCOUNTER — Other Ambulatory Visit: Payer: Self-pay

## 2020-12-16 ENCOUNTER — Other Ambulatory Visit: Payer: BC Managed Care – PPO

## 2020-12-16 DIAGNOSIS — R972 Elevated prostate specific antigen [PSA]: Secondary | ICD-10-CM

## 2020-12-17 LAB — PSA: PSA: 1.37 ng/mL (ref <=4.00)

## 2020-12-19 ENCOUNTER — Encounter: Payer: Self-pay | Admitting: *Deleted

## 2020-12-26 ENCOUNTER — Telehealth: Payer: Self-pay | Admitting: Nurse Practitioner

## 2020-12-26 NOTE — Telephone Encounter (Signed)
Received voicemail message from paitent's spouse Lattie Haw to request lab work results. Please advise at [phone].

## 2020-12-28 NOTE — Telephone Encounter (Signed)
Called and spoke w/patient advised of his lab results. Informed pt that we have mail out his results.

## 2021-01-02 ENCOUNTER — Telehealth: Payer: Self-pay | Admitting: Nurse Practitioner

## 2021-01-02 NOTE — Telephone Encounter (Signed)
Patient's spouse is a patient of Dr. Samella Parr; requesting for patient to transfer care to [facility]. Please advise at [phone]

## 2021-01-02 NOTE — Telephone Encounter (Signed)
Please contact patient to schedule appointment.

## 2021-01-02 NOTE — Telephone Encounter (Signed)
Please advise 

## 2021-01-03 ENCOUNTER — Ambulatory Visit: Payer: BC Managed Care – PPO

## 2021-01-10 NOTE — Telephone Encounter (Signed)
Epic updated. Patient will call back to schedule.

## 2021-01-20 ENCOUNTER — Encounter: Payer: Self-pay | Admitting: Family Medicine

## 2021-01-20 ENCOUNTER — Other Ambulatory Visit: Payer: Self-pay

## 2021-01-20 ENCOUNTER — Ambulatory Visit (INDEPENDENT_AMBULATORY_CARE_PROVIDER_SITE_OTHER): Payer: BC Managed Care – PPO | Admitting: Family Medicine

## 2021-01-20 VITALS — BP 124/78 | HR 60 | Temp 98.2°F | Resp 16 | Ht 69.0 in | Wt 172.0 lb

## 2021-01-20 DIAGNOSIS — Z23 Encounter for immunization: Secondary | ICD-10-CM | POA: Diagnosis not present

## 2021-01-20 DIAGNOSIS — Z7689 Persons encountering health services in other specified circumstances: Secondary | ICD-10-CM | POA: Diagnosis not present

## 2021-01-20 MED ORDER — ALBUTEROL SULFATE HFA 108 (90 BASE) MCG/ACT IN AERS: 2.0000 | INHALATION_SPRAY | Freq: Four times a day (QID) | RESPIRATORY_TRACT | 0 refills | Status: DC | PRN

## 2021-01-20 NOTE — Progress Notes (Signed)
Subjective:    Patient ID: Thomas Mcdonald, male    DOB: 1960-05-20, 60 y.o.   MRN: [id number]  HPI Patient is here today to establish care with me.  I have not seen the patient in the last 4 years.  He has a history of asthma as a child.  He also has a history of tobacco abuse.  He still smokes.  Today on exam he is wheezing.  Therefore he does have ongoing asthma likely exacerbated by smoke.  He states that he has not used an albuterol inhaler in years.  He denies any chest pain shortness of breath or dyspnea on exertion.  He is due for a flu shot.  He is already had Pneumovax 23.  He is due for shingles vaccine as well as a COVID booster.  His last colonoscopy was in April of this year.  They found 5 polyps some of which were tubular adenomas.  He is due for repeat colonoscopy in 3 years.  His PSA this year was elevated at 2.92.  However repeat PSA 1 month later showed it had dropped to 1.3.  He has not had any lung cancer screening. Lab on 12/16/2020  Component Date Value Ref Range Status   PSA 12/16/2020 1.37  < OR = 4.00 ng/mL Final   Comment: The total PSA value from this assay system is  standardized against the WHO standard. The test  result will be approximately 20% lower when compared  to the equimolar-standardized total PSA (Beckman  Coulter). Comparison of serial PSA results should be  interpreted with this fact in mind. . This test was performed using the Siemens  chemiluminescent method. Values obtained from  different assay methods cannot be used interchangeably. PSA levels, regardless of value, should not be interpreted as absolute evidence of the presence or absence of disease.   Office Visit on 11/11/2020  Component Date Value Ref Range Status   WBC 11/11/2020 8.6  3.8 - 10.8 Thousand/uL Final   RBC 11/11/2020 4.34  4.20 - 5.80 Million/uL Final   Hemoglobin 11/11/2020 13.7  13.2 - 17.1 g/dL Final   HCT 11/11/2020 41.0  38.5 - 50.0 % Final   MCV 11/11/2020 94.5   80.0 - 100.0 fL Final   MCH 11/11/2020 31.6  27.0 - 33.0 pg Final   MCHC 11/11/2020 33.4  32.0 - 36.0 g/dL Final   RDW 11/11/2020 12.9  11.0 - 15.0 % Final   Platelets 11/11/2020 246  140 - 400 Thousand/uL Final   MPV 11/11/2020 10.4  7.5 - 12.5 fL Final   Neutro Abs 11/11/2020 5,169  1,500 - 7,800 cells/uL Final   Lymphs Abs 11/11/2020 2,305  850 - 3,900 cells/uL Final   Absolute Monocytes 11/11/2020 671  200 - 950 cells/uL Final   Eosinophils Absolute 11/11/2020 396  15 - 500 cells/uL Final   Basophils Absolute 11/11/2020 60  0 - 200 cells/uL Final   Neutrophils Relative % 11/11/2020 60.1  % Final   Total Lymphocyte 11/11/2020 26.8  % Final   Monocytes Relative 11/11/2020 7.8  % Final   Eosinophils Relative 11/11/2020 4.6  % Final   Basophils Relative 11/11/2020 0.7  % Final   Glucose, Bld 11/11/2020 77  65 - 99 mg/dL Final   Comment: .            Fasting reference interval .    BUN 11/11/2020 11  7 - 25 mg/dL Final   Creat 11/11/2020 1.05  0.70 - 1.35 mg/dL Final  eGFR 11/11/2020 81  > OR = 60 mL/min/1.46m Final   Comment: The eGFR is based on the CKD-EPI 2021 equation. To calculate  the new eGFR from a previous Creatinine or Cystatin C result, go to https://www.kidney.org/professionals/ kdoqi/gfr%5Fcalculator    BUN/Creatinine Ratio 008/14/4818NOT APPLICABLE  6 - 22 (calc) Final   Sodium 11/11/2020 139  135 - 146 mmol/L Final   Potassium 11/11/2020 4.1  3.5 - 5.3 mmol/L Final   Chloride 11/11/2020 104  98 - 110 mmol/L Final   CO2 11/11/2020 27  20 - 32 mmol/L Final   Calcium 11/11/2020 8.9  8.6 - 10.3 mg/dL Final   Total Protein 11/11/2020 6.5  6.1 - 8.1 g/dL Final   Albumin 11/11/2020 4.3  3.6 - 5.1 g/dL Final   Globulin 11/11/2020 2.2  1.9 - 3.7 g/dL (calc) Final   AG Ratio 11/11/2020 2.0  1.0 - 2.5 (calc) Final   Total Bilirubin 11/11/2020 0.7  0.2 - 1.2 mg/dL Final   Alkaline phosphatase (APISO) 11/11/2020 72  35 - 144 U/L Final   AST 11/11/2020 26  10 - 35 U/L  Final   ALT 11/11/2020 22  9 - 46 U/L Final   Cholesterol 11/11/2020 167  <200 mg/dL Final   HDL 11/11/2020 63  > OR = 40 mg/dL Final   Triglycerides 11/11/2020 58  <150 mg/dL Final   LDL Cholesterol (Calc) 11/11/2020 90  mg/dL (calc) Final   Comment: Reference range: <100 . Desirable range <100 mg/dL for primary prevention;   <70 mg/dL for patients with CHD or diabetic patients  with > or = 2 CHD risk factors. .Marland KitchenLDL-C is now calculated using the Martin-Hopkins  calculation, which is a validated novel method providing  better accuracy than the Friedewald equation in the  estimation of LDL-C.  MCresenciano Genreet al. JAnnamaria Helling 25631;497(02: 2061-2068  (http://education.[employer].com/faq/FAQ164)    Total CHOL/HDL Ratio 11/11/2020 2.7  <5.0 (calc) Final   Non-HDL Cholesterol (Calc) 11/11/2020 104  <130 mg/dL (calc) Final   Comment: For patients with diabetes plus 1 major ASCVD risk  factor, treating to a non-HDL-C goal of <100 mg/dL  (LDL-C of <70 mg/dL) is considered a therapeutic  option.    PSA 11/11/2020 2.92  < OR = 4.00 ng/mL Final   Comment: The total PSA value from this assay system is  standardized against the WHO standard. The test  result will be approximately 20% lower when compared  to the equimolar-standardized total PSA (Beckman  Coulter). Comparison of serial PSA results should be  interpreted with this fact in mind. . This test was performed using the Siemens  chemiluminescent method. Values obtained from  different assay methods cannot be used interchangeably. PSA levels, regardless of value, should not be interpreted as absolute evidence of the presence or absence of disease.    Had colonoscopy in April 2022.  5 polyps were removed several which were tubular adenomas.  GI has recommended a repeat colonoscopy in 3 years. Past Medical History:  Diagnosis Date   Asthma    no inhaler   Past Surgical History:  Procedure Laterality Date   KNEE ARTHROSCOPY WITH  MEDIAL MENISECTOMY Right 06/25/2018   Procedure: RIGHT KNEE ARTHROSCOPY WITH PARTIAL MEDIAL MENISCECTOMY;  Surgeon: XLeandrew Koyanagi MD;  Location: [facility]  Service: Orthopedics;  Laterality: Right;   NO PAST SURGERIES     SYNOVECTOMY Right 06/25/2018   Procedure: SYNOVECTOMY;  Surgeon: XLeandrew Koyanagi MD;  Location: [facility]  Service: Orthopedics;  Laterality: Right;   Current Outpatient Medications on File Prior to Visit  Medication Sig Dispense Refill   Acetaminophen (TYLENOL ARTHRITIS EXT RELIEF PO) Take by mouth.     Multiple Vitamin (MULTIVITAMIN WITH MINERALS) TABS tablet Take 1 tablet by mouth daily.     No current facility-administered medications on file prior to visit.   Allergies  Allergen Reactions   Shellfish Allergy Hives   Social History   Socioeconomic History   Marital status: Single    Spouse name: Not on file   Number of children: Not on file   Years of education: Not on file   Highest education level: Not on file  Occupational History   Not on file  Tobacco Use   Smoking status: Every Day    Packs/day: 0.50    Types: Cigarettes   Smokeless tobacco: Never  Vaping Use   Vaping Use: Never used  Substance and Sexual Activity   Alcohol use: Yes    Alcohol/week: 21.0 standard drinks    Types: 21 Cans of beer per week    Comment: wife describes drinking a moderate amt daily   Drug use: No    Comment: H/O drug abuse. Prefers minimal narcotics   Sexual activity: Yes    Partners: Female  Other Topics Concern   Not on file  Social History Narrative   Not on file   Social Determinants of Health   Financial Resource Strain: Not on file  Food Insecurity: Not on file  Transportation Needs: Not on file  Physical Activity: Not on file  Stress: Not on file  Social Connections: Not on file  Intimate Partner Violence: Not on file     Review of Systems  All other systems reviewed and are negative.     Objective:    Physical Exam Vitals reviewed.  Constitutional:      General: He is not in acute distress.    Appearance: Normal appearance. He is not ill-appearing, toxic-appearing or diaphoretic.  HENT:     Mouth/Throat:     Mouth: Mucous membranes are moist.     Pharynx: No oropharyngeal exudate or posterior oropharyngeal erythema.  Eyes:     Conjunctiva/sclera: Conjunctivae normal.     Pupils: Pupils are equal, round, and reactive to light.  Cardiovascular:     Rate and Rhythm: Normal rate and regular rhythm.     Pulses: Normal pulses.     Heart sounds: Normal heart sounds. No murmur heard.   No friction rub. No gallop.  Pulmonary:     Effort: Pulmonary effort is normal. No respiratory distress.     Breath sounds: No stridor. Wheezing present. No rhonchi or rales.  Abdominal:     General: Abdomen is flat. Bowel sounds are normal. There is no distension.     Palpations: Abdomen is soft.     Tenderness: There is no abdominal tenderness. There is no guarding.  Musculoskeletal:     Right lower leg: No edema.     Left lower leg: No edema.  Skin:    General: Skin is warm.     Coloration: Skin is not jaundiced or pale.     Findings: No bruising, erythema, lesion or rash.  Neurological:     General: No focal deficit present.     Mental Status: He is alert and oriented to person, place, and time. Mental status is at baseline.     Cranial Nerves: No cranial nerve deficit.     Sensory: No  sensory deficit.     Motor: No weakness.     Gait: Gait normal.          Assessment & Plan:   Establishing care with new doctor, encounter for Recommended smoking cessation.  Cancer screening is up-to-date except for a CT scan to screen for lung cancer.  We discussed this in detail and the patient will consider it and let me know if he wants me to schedule him for the CT scan.  Patient received his flu shot today.  I recommended a Shingrix as well as a COVID booster.  I also gave the patient albuterol to be  used as needed for wheezing.  Patient does have underlying asthma and is audibly wheezing today on exam and so I feel that it would be prudent for him to have a rescue inhaler in case he has an attack.  Continue to encourage smoking cessation.  Follow-up for complete physical exam in 1 year.

## 2022-02-13 ENCOUNTER — Ambulatory Visit (INDEPENDENT_AMBULATORY_CARE_PROVIDER_SITE_OTHER): Payer: BC Managed Care – PPO | Admitting: Family Medicine

## 2022-02-13 VITALS — BP 124/76 | HR 85 | Ht 69.0 in | Wt 163.4 lb

## 2022-02-13 DIAGNOSIS — F321 Major depressive disorder, single episode, moderate: Secondary | ICD-10-CM

## 2022-02-13 DIAGNOSIS — F1910 Other psychoactive substance abuse, uncomplicated: Secondary | ICD-10-CM | POA: Diagnosis not present

## 2022-02-13 DIAGNOSIS — Z23 Encounter for immunization: Secondary | ICD-10-CM

## 2022-02-13 MED ORDER — LEXAPRO 10 MG PO TABS: 10.0000 mg | ORAL_TABLET | Freq: Every day | ORAL | 5 refills | Status: DC

## 2022-02-13 NOTE — Progress Notes (Signed)
Subjective:    Patient ID: Thomas Mcdonald, male    DOB: 1961-01-08, 61 y.o.   MRN: [id number]  HPI Patient is a very pleasant 61 year old gentleman here today requesting a referral for outpatient alcohol and drug treatment.  He states that he drinks beer every other day.  He drinks hard liquor once a week.  However he admits to abusing crack cocaine.  He states that his last use was 1 week ago.  He reports feeling depressed.  He admits that he is self-medicating with crack to treat depression.  He lives with his sister.  He states that he has a good group of support within his family.  He denies any suicidal or homicidal ideation.  He denies any hallucinations.  He denies any history of bipolar.  But he is interested in medication to help with the depression because he feels like this is partly what is triggering his abuse of cocaine.  He also would like a referral to an outpatient treatment center Past Medical History:  Diagnosis Date   Asthma    no inhaler   Past Surgical History:  Procedure Laterality Date   KNEE ARTHROSCOPY WITH MEDIAL MENISECTOMY Right 06/25/2018   Procedure: RIGHT KNEE ARTHROSCOPY WITH PARTIAL MEDIAL MENISCECTOMY;  Surgeon: Leandrew Koyanagi, MD;  Location: [facility];  Service: Orthopedics;  Laterality: Right;   NO PAST SURGERIES     SYNOVECTOMY Right 06/25/2018   Procedure: SYNOVECTOMY;  Surgeon: Leandrew Koyanagi, MD;  Location: [facility];  Service: Orthopedics;  Laterality: Right;   Current Outpatient Medications on File Prior to Visit  Medication Sig Dispense Refill   Acetaminophen (TYLENOL ARTHRITIS EXT RELIEF PO) Take by mouth.     albuterol (VENTOLIN HFA) 108 (90 Base) MCG/ACT inhaler Inhale 2 puffs into the lungs every 6 (six) hours as needed for wheezing or shortness of breath. 8 g 0   Multiple Vitamin (MULTIVITAMIN WITH MINERALS) TABS tablet Take 1 tablet by mouth daily.     No current facility-administered medications on file prior  to visit.   Allergies  Allergen Reactions   Shellfish Allergy Hives   Social History   Socioeconomic History   Marital status: Single    Spouse name: Not on file   Number of children: Not on file   Years of education: Not on file   Highest education level: Not on file  Occupational History   Not on file  Tobacco Use   Smoking status: Every Day    Packs/day: 0.50    Types: Cigarettes   Smokeless tobacco: Never  Vaping Use   Vaping Use: Never used  Substance and Sexual Activity   Alcohol use: Yes    Alcohol/week: 21.0 standard drinks of alcohol    Types: 21 Cans of beer per week    Comment: wife describes drinking a moderate amt daily   Drug use: No    Comment: H/O drug abuse. Prefers minimal narcotics   Sexual activity: Yes    Partners: Female  Other Topics Concern   Not on file  Social History Narrative   Not on file   Social Determinants of Health   Financial Resource Strain: Not on file  Food Insecurity: Not on file  Transportation Needs: Not on file  Physical Activity: Not on file  Stress: Not on file  Social Connections: Not on file  Intimate Partner Violence: Not on file     Review of Systems  All other systems reviewed and are negative.  Objective:   Physical Exam Vitals reviewed.  Constitutional:      General: He is not in acute distress.    Appearance: Normal appearance. He is not ill-appearing, toxic-appearing or diaphoretic.  HENT:     Mouth/Throat:     Mouth: Mucous membranes are moist.     Pharynx: No oropharyngeal exudate or posterior oropharyngeal erythema.  Eyes:     Conjunctiva/sclera: Conjunctivae normal.     Pupils: Pupils are equal, round, and reactive to light.  Cardiovascular:     Rate and Rhythm: Normal rate and regular rhythm.     Pulses: Normal pulses.     Heart sounds: Normal heart sounds. No murmur heard.    No friction rub. No gallop.  Pulmonary:     Effort: Pulmonary effort is normal. No respiratory distress.      Breath sounds: No stridor. Wheezing present. No rhonchi or rales.  Abdominal:     General: Abdomen is flat. Bowel sounds are normal. There is no distension.     Palpations: Abdomen is soft.     Tenderness: There is no abdominal tenderness. There is no guarding.  Musculoskeletal:     Right lower leg: No edema.     Left lower leg: No edema.  Skin:    General: Skin is warm.     Coloration: Skin is not jaundiced or pale.     Findings: No bruising, erythema, lesion or rash.  Neurological:     General: No focal deficit present.     Mental Status: He is alert and oriented to person, place, and time. Mental status is at baseline.     Cranial Nerves: No cranial nerve deficit.     Sensory: No sensory deficit.     Motor: No weakness.     Gait: Gait normal.           Assessment & Plan:  Need for immunization against influenza - Plan: Flu Vaccine QUAD 6+ mos PF IM (Fluarix Quad PF)  Substance abuse (HCC)  Depression, major, single episode, moderate ([place]) I gave the patient the contact information for [facility] which is a local outpatient and inpatient drug treatment program.  The patient is also correction to their facility which is only 5 minutes from this office.  He will contact them immediately upon leaving.  We also discussed treatment for depression as I feel this is a contributing factor to his substance abuse.  Begin Lexapro 10 mg daily and reassess in 6 weeks.  I congratulated the patient on seeking help.  I wished him the best of luck and I will be glad to help in any way moving forward.

## 2023-08-12 ENCOUNTER — Ambulatory Visit (INDEPENDENT_AMBULATORY_CARE_PROVIDER_SITE_OTHER): Admitting: Family Medicine

## 2023-08-12 ENCOUNTER — Encounter: Payer: Self-pay | Admitting: Family Medicine

## 2023-08-12 VITALS — BP 120/78 | HR 84 | Ht 69.0 in | Wt 165.0 lb

## 2023-08-12 DIAGNOSIS — L720 Epidermal cyst: Secondary | ICD-10-CM | POA: Diagnosis not present

## 2023-08-12 NOTE — Assessment & Plan Note (Signed)
 0.25cm skin colored nodule to right lower back without redness, warmth, or drainage with presence of central punctate. Discussed treatment options  and patient would like to defer drainage, there is no s/s of infection. Recommended warm compress. Return to office for redness, warmth, purulent drainage, fever, chills, body aches.

## 2023-08-12 NOTE — Progress Notes (Signed)
 Subjective:  HPI: Thomas Mcdonald is a 63 y.o. male presenting on 08/12/2023 for Mass   HPI Patient is in today for small mass on his back that has been present for 1 week. This is not painful, warm, or draining. No fever, chills, or body aches. Has tried nothing.   Review of Systems  All other systems reviewed and are negative.   Relevant past medical history reviewed and updated as indicated.   Past Medical History:  Diagnosis Date   Asthma    no inhaler     Past Surgical History:  Procedure Laterality Date   KNEE ARTHROSCOPY WITH MEDIAL MENISECTOMY Right 06/25/2018   Procedure: RIGHT KNEE ARTHROSCOPY WITH PARTIAL MEDIAL MENISCECTOMY;  Surgeon: Wes Hamman, MD;  Location: [facility];  Service: Orthopedics;  Laterality: Right;   NO PAST SURGERIES     SYNOVECTOMY Right 06/25/2018   Procedure: SYNOVECTOMY;  Surgeon: Wes Hamman, MD;  Location: [facility];  Service: Orthopedics;  Laterality: Right;    Allergies and medications reviewed and updated.   Current Outpatient Medications:    Acetaminophen  (TYLENOL  ARTHRITIS EXT RELIEF PO), Take by mouth. (Patient not taking: Reported on 08/12/2023), Disp: , Rfl:    albuterol  (VENTOLIN  HFA) 108 (90 Base) MCG/ACT inhaler, Inhale 2 puffs into the lungs every 6 (six) hours as needed for wheezing or shortness of breath. (Patient not taking: Reported on 08/12/2023), Disp: 8 g, Rfl: 0   LEXAPRO  10 MG tablet, Take 1 tablet (10 mg total) by mouth daily. (Patient not taking: Reported on 08/12/2023), Disp: 30 tablet, Rfl: 5   Multiple Vitamin (MULTIVITAMIN WITH MINERALS) TABS tablet, Take 1 tablet by mouth daily. (Patient not taking: Reported on 08/12/2023), Disp: , Rfl:   Allergies  Allergen Reactions   Shellfish Allergy Hives    Objective:   BP 120/78   Pulse 84   Ht 5\' 9"  (1.753 m)   Wt 165 lb (74.8 kg)   SpO2 98%   BMI 24.37 kg/m      08/12/2023   11:48 AM 02/13/2022   11:02 AM 01/20/2021    9:31 AM   Vitals with BMI  Height 5\' 9"  5\' 9"  5\' 9"   Weight 165 lbs 163 lbs 6 oz 172 lbs  BMI 24.36 24.12 25.39  Systolic 120 124 782  Diastolic 78 76 78  Pulse 84 85 60     Physical Exam Vitals and nursing note reviewed.  Constitutional:      Appearance: Normal appearance. He is normal weight.  HENT:     Head: Normocephalic and atraumatic.  Skin:    General: Skin is warm and dry.     Capillary Refill: Capillary refill takes less than 2 seconds.          Comments: 0.25cm nodule without redness, warmth, or drainage  Neurological:     General: No focal deficit present.     Mental Status: He is alert and oriented to person, place, and time. Mental status is at baseline.  Psychiatric:        Mood and Affect: Mood normal.        Behavior: Behavior normal.        Thought Content: Thought content normal.        Judgment: Judgment normal.     Assessment & Plan:  Epidermal inclusion cyst Assessment & Plan: 0.25cm skin colored nodule to right lower back without redness, warmth, or drainage with presence of central punctate. Discussed treatment options  and  patient would like to defer drainage, there is no s/s of infection. Recommended warm compress. Return to office for redness, warmth, purulent drainage, fever, chills, body aches.       Follow up plan: Return if symptoms worsen or fail to improve.  Jenelle Mis, FNP

## 2023-10-03 ENCOUNTER — Encounter: Payer: Self-pay | Admitting: Emergency Medicine

## 2023-10-18 ENCOUNTER — Telehealth: Payer: Self-pay | Admitting: *Deleted

## 2023-10-18 ENCOUNTER — Other Ambulatory Visit: Payer: Self-pay | Admitting: *Deleted

## 2023-10-18 DIAGNOSIS — F17210 Nicotine dependence, cigarettes, uncomplicated: Secondary | ICD-10-CM

## 2023-10-18 DIAGNOSIS — Z87891 Personal history of nicotine dependence: Secondary | ICD-10-CM

## 2023-10-18 DIAGNOSIS — Z122 Encounter for screening for malignant neoplasm of respiratory organs: Secondary | ICD-10-CM

## 2023-10-18 NOTE — Telephone Encounter (Signed)
 Lung Cancer Screening Narrative/Criteria Questionnaire (Cigarette Smokers Only- No Cigars/Pipes/vapes)   Thomas Mcdonald   SDMV:10/30/23 10:00- [place]                                           May 22, 1960              LDCT: 10/31/23 10:30- [facility]    63 y.o.   Phone: [phone]  Lung Screening Narrative (confirm age 18-77 yrs Medicare / 50-80 yrs Private pay insurance)   Insurance information:BCBS   Referring Provider:[facility]   This screening involves an initial phone call with a team member from our program. It is called a shared decision making visit. The initial meeting is required by insurance and Medicare to make sure you understand the program. This appointment takes about 15-20 minutes to complete. The CT scan will completed at a separate date/time. This scan takes about 5-10 minutes to complete and you may eat and drink before and after the scan.  Criteria questions for Lung Cancer Screening:   Are you a current or former smoker? Current Age began smoking: 45   If you are a former smoker, what year did you quit smoking? (within 15 yrs)   To calculate your smoking history, I need an accurate estimate of how many packs of cigarettes you smoked per day and for how many years. (Not just the number of PPD you are now smoking)   Years smoking 47 x Packs per day 1/2-1 = Pack years 33   (at least 20 pack yrs)   (Make sure they understand that we need to know how much they have smoked in the past, not just the number of PPD they are smoking now)  Do you have a personal history of cancer?  No    Do you have a family history of cancer? Yes  (cancer type and and relative) Mother (Breast)  Are you coughing up blood?  No  Have you had unexplained weight loss of 15 lbs or more in the last 6 months? No  It looks like you meet all criteria.     Additional information: N/A

## 2023-10-30 ENCOUNTER — Encounter: Admitting: Adult Health

## 2023-10-30 ENCOUNTER — Encounter: Payer: Self-pay | Admitting: Emergency Medicine

## 2023-10-30 NOTE — Telephone Encounter (Signed)
 Copied from CRM #[id number]. Topic: Appointments - Scheduling Inquiry for Clinic >> Oct 30, 2023 12:22 PM Corean SAUNDERS wrote: Reason for CRM: Please call patient back to reschedule his shared decision visit with Lamarr Myers as he was marked as no show but no one called him - Patient is asking to be called after 1 PM as he is in a meeting.    Called patient back and rescheduled both SDMV and LDCT for 11/06/2023. Pt verbalized understanding and denied any further questions or concerns at this time.

## 2023-10-31 ENCOUNTER — Ambulatory Visit

## 2023-11-06 ENCOUNTER — Ambulatory Visit
Admission: RE | Admit: 2023-11-06 | Discharge: 2023-11-06 | Disposition: A | Discharge status: Home / Self Care | Source: Ambulatory Visit | Attending: Acute Care | Admitting: Acute Care

## 2023-11-06 ENCOUNTER — Ambulatory Visit: Admitting: *Deleted

## 2023-11-06 ENCOUNTER — Encounter: Payer: Self-pay | Admitting: *Deleted

## 2023-11-06 DIAGNOSIS — Z122 Encounter for screening for malignant neoplasm of respiratory organs: Secondary | ICD-10-CM | POA: Insufficient documentation

## 2023-11-06 DIAGNOSIS — F17210 Nicotine dependence, cigarettes, uncomplicated: Secondary | ICD-10-CM | POA: Insufficient documentation

## 2023-11-06 DIAGNOSIS — Z87891 Personal history of nicotine dependence: Secondary | ICD-10-CM | POA: Diagnosis present

## 2023-11-06 NOTE — Progress Notes (Unsigned)
 Virtual Visit via Telephone Note  I connected with Thomas Mcdonald on 11/06/23 at  8:30 AM EDT by telephone and verified that I am speaking with the correct person using two identifiers.  Location: Patient: [name] Provider: Laneta Speaks, RN   I discussed the limitations, risks, security and privacy concerns of performing an evaluation and management service by telephone and the availability of in person appointments. I also discussed with the patient that there may be a patient responsible charge related to this service. The patient expressed understanding and agreed to proceed.  Shared Decision Making Visit Lung Cancer Screening Program [phone])   Eligibility: Age 63 y.o. Pack Years Smoking History Calculation 35 (# packs/per year x # years smoked) Recent History of coughing up blood  no Unexplained weight loss? no ( >Than 15 pounds within the last 6 months ) Prior History Lung / other cancer no (Diagnosis within the last 5 years already requiring surveillance chest CT Scans). Smoking Status Current Smoker Former Smokers: Years since quit: n/a  Quit Date: n/a  Visit Components: Discussion included one or more decision making aids. yes Discussion included risk/benefits of screening. yes Discussion included potential follow up diagnostic testing for abnormal scans. yes Discussion included meaning and risk of over diagnosis. yes Discussion included meaning and risk of False Positives. yes Discussion included meaning of total radiation exposure. yes  Counseling Included: Importance of adherence to annual lung cancer LDCT screening. yes Impact of comorbidities on ability to participate in the program. yes Ability and willingness to under diagnostic treatment. yes  Smoking Cessation Counseling: Current Smokers:  Discussed importance of smoking cessation. yes Information about tobacco cessation classes and interventions provided to patient. yes Patient provided  with ticket for LDCT Scan. no Symptomatic Patient. no  Counseling(Intermediate counseling: > three minutes) 99406 Diagnosis Code: Tobacco Use Z72.0 Asymptomatic Patient yes  Counseling (Intermediate counseling: > three minutes counseling) [id number] Former Smokers:  Discussed the importance of maintaining cigarette abstinence. yes Diagnosis Code: Personal History of Nicotine Dependence. S12.108 Information about tobacco cessation classes and interventions provided to patient. Yes Patient provided with ticket for LDCT Scan. no Written Order for Lung Cancer Screening with LDCT placed in Epic. Yes (CT Chest Lung Cancer Screening Low Dose W/O CM) [id number] Z12.2-Screening of respiratory organs Z87.891-Personal history of nicotine dependence   Laneta Speaks, RN

## 2023-11-06 NOTE — Patient Instructions (Signed)

## 2023-11-21 ENCOUNTER — Other Ambulatory Visit: Payer: Self-pay

## 2023-11-21 ENCOUNTER — Telehealth: Payer: Self-pay | Admitting: Acute Care

## 2023-11-21 DIAGNOSIS — Z122 Encounter for screening for malignant neoplasm of respiratory organs: Secondary | ICD-10-CM

## 2023-11-21 DIAGNOSIS — Z87891 Personal history of nicotine dependence: Secondary | ICD-10-CM

## 2023-11-21 DIAGNOSIS — F17210 Nicotine dependence, cigarettes, uncomplicated: Secondary | ICD-10-CM

## 2023-11-21 NOTE — Telephone Encounter (Signed)
 Spoke with patient and reviewed recent lung CT results. He will complete an annual Lung CT again next year. Order placed. He will follow up with PCP Edsel Pepper, PA to discuss the need for additional imaging of his liver. He has no additional questions. Results and plan previously sent to PCP by Ruthell, NP.

## 2023-11-21 NOTE — Telephone Encounter (Signed)
 Results of LDCT reviewed by Lauraine Lites, NP.  Results were faxed to the PCP requesting to review liver findings and advise the patient if further imaging is needed. Please advise patient of LR2 findings and follow up with PCP regarding further imaging of liver. Order placed for annual LDCT.

## 2023-11-26 NOTE — Progress Notes (Signed)
 Spent 3 1/2 minutes intermediate counseling patient on importance of smoking cessation.

## 2023-11-27 ENCOUNTER — Other Ambulatory Visit: Payer: Self-pay | Admitting: Student

## 2023-11-27 DIAGNOSIS — R160 Hepatomegaly, not elsewhere classified: Secondary | ICD-10-CM

## 2023-11-27 DIAGNOSIS — K7689 Other specified diseases of liver: Secondary | ICD-10-CM

## 2023-12-06 ENCOUNTER — Ambulatory Visit

## 2024-03-07 ENCOUNTER — Encounter: Payer: Self-pay | Admitting: Internal Medicine

## 2024-05-01 ENCOUNTER — Ambulatory Visit

## 2024-05-01 ENCOUNTER — Encounter: Payer: Self-pay | Admitting: Internal Medicine

## 2024-05-01 VITALS — Ht 69.0 in | Wt 170.0 lb

## 2024-05-01 DIAGNOSIS — Z860100 Personal history of colon polyps, unspecified: Secondary | ICD-10-CM

## 2024-05-01 MED ORDER — NA SULFATE-K SULFATE-MG SULF 17.5-3.13-1.6 GM/177ML PO SOLN: 1.0000 | Freq: Once | ORAL | 0 refills | Status: AC

## 2024-05-01 NOTE — Progress Notes (Signed)
 PCP MD at time of PV: Franchot, [place] - C __________________________________________________________________________________________________________________________________________  No egg allergy known to patient  No soy allergy known to patient No issues known to pt with past sedation with any surgeries or procedures Patient denies ever being told they had issues or difficulty with intubation  No FH of Malignant Hyperthermia Pt is not on diet pills Pt is not on  home 02  Pt is not on blood thinners  No A fib or A flutter Have any cardiac testing pending--no LOA: independent No Chew or Snuff tobacco __________________________________________________________________________________________________________________________________________  Constipation: no  Prep: sutab  __________________________________________________________________________________________________________________________________________  PV completed with patient. Prep instructions reviewed and provided during apt. Rx sent to preferred pharmacy.  __________________________________________________________________________________________________________________________________________  Patient's chart reviewed by Norleen Schillings CNRA prior to previsit and patient appropriate for the [facility].  Previsit completed and red dot placed by patient's name on their procedure day (on provider's schedule).

## 2024-05-13 ENCOUNTER — Encounter: Payer: Self-pay | Admitting: Internal Medicine

## 2024-05-13 ENCOUNTER — Ambulatory Visit: Admitting: Internal Medicine

## 2024-05-13 VITALS — BP 115/66 | HR 70 | Temp 98.2°F | Resp 12 | Ht 69.0 in | Wt 170.0 lb

## 2024-05-13 DIAGNOSIS — D122 Benign neoplasm of ascending colon: Secondary | ICD-10-CM

## 2024-05-13 DIAGNOSIS — Z860100 Personal history of colon polyps, unspecified: Secondary | ICD-10-CM

## 2024-05-13 DIAGNOSIS — D124 Benign neoplasm of descending colon: Secondary | ICD-10-CM

## 2024-05-13 MED ORDER — SODIUM CHLORIDE 0.9 % IV SOLN: 500.0000 mL | Freq: Once | INTRAVENOUS | Status: DC

## 2024-05-13 NOTE — Progress Notes (Signed)
 To pacu, VSS. Report to [email or web address].tb

## 2024-05-13 NOTE — Op Note (Signed)
 [facility] Patient Name: Thomas Mcdonald Procedure Date: 05/13/2024 8:11 AM MRN: [id number] Endoscopist: Gordy CHRISTELLA Starch , MD, [id number] Age: 64 Referring MD:  Date of Birth: 28-Sep-1960 Gender: Male Account #: [id number] Procedure:                Colonoscopy Indications:              High risk colon cancer surveillance: Personal                            history of multiple adenomas, Last colonoscopy:                            April 2022 (index exam, TA x 5) Medicines:                Monitored Anesthesia Care Procedure:                Pre-Anesthesia Assessment:                           - Prior to the procedure, a History and Physical                            was performed, and patient medications and                            allergies were reviewed. The patient's tolerance of                            previous anesthesia was also reviewed. The risks                            and benefits of the procedure and the sedation                            options and risks were discussed with the patient.                            All questions were answered, and informed consent                            was obtained. Prior Anticoagulants: The patient has                            taken no anticoagulant or antiplatelet agents. ASA                            Grade Assessment: II - A patient with mild systemic                            disease. After reviewing the risks and benefits,                            the patient was deemed in satisfactory condition to  undergo the procedure.                           After obtaining informed consent, the colonoscope                            was passed under direct vision. Throughout the                            procedure, the patient's blood pressure, pulse, and                            oxygen saturations were monitored continuously. The                            CF [id number] #[id number] was  introduced through the anus                            and advanced to the cecum, identified by                            appendiceal orifice and ileocecal valve. The                            colonoscopy was performed without difficulty. The                            patient tolerated the procedure well. The quality                            of the bowel preparation was good. The ileocecal                            valve, appendiceal orifice, and rectum were                            photographed. Scope In: 8:15:24 AM Scope Out: 8:31:12 AM Scope Withdrawal Time: 0 hours 11 minutes 1 second  Total Procedure Duration: 0 hours 15 minutes 48 seconds  Findings:                 The digital rectal exam was normal.                           Two sessile polyps were found in the ascending                            colon. The polyps were 4 to 6 mm in size. These                            polyps were removed with a cold snare. Resection                            and retrieval were complete.  A 3 mm polyp was found in the descending colon. The                            polyp was sessile. The polyp was removed with a                            cold snare. Resection and retrieval were complete.                           Multiple medium-mouthed and small-mouthed                            diverticula were found from cecum to sigmoid colon.                           The retroflexed view of the distal rectum and anal                            verge was normal and showed no anal or rectal                            abnormalities. Complications:            No immediate complications. Estimated Blood Loss:     Estimated blood loss: none. Impression:               - Two 4 to 6 mm polyps in the ascending colon,                            removed with a cold snare. Resected and retrieved.                           - One 3 mm polyp in the descending colon, removed                             with a cold snare. Resected and retrieved.                           - Moderate diverticulosis from cecum to sigmoid                            colon.                           - The distal rectum and anal verge are normal on                            retroflexion view. Recommendation:           - Patient has a contact number available for                            emergencies. The signs and symptoms of potential  delayed complications were discussed with the                            patient. Return to normal activities tomorrow.                            Written discharge instructions were provided to the                            patient.                           - Resume previous diet.                           - Continue present medications.                           - Await pathology results.                           - Repeat colonoscopy is recommended for                            surveillance. The colonoscopy date will be                            determined after pathology results from today's                            exam become available for review. Gordy CHRISTELLA Starch, MD 05/13/2024 8:35:00 AM This report has been signed electronically.

## 2024-05-13 NOTE — Progress Notes (Signed)
 "   GASTROENTEROLOGY PROCEDURE H&P NOTE   Primary Care Physician: Franchot Houston, PA-C    Reason for Procedure:   Hx of polyps  Plan:    colonoscopy  Patient is appropriate for endoscopic procedure(s) in the ambulatory ([facility]) setting.  The nature of the procedure, as well as the risks, benefits, and alternatives were carefully and thoroughly reviewed with the patient. Ample time for discussion and questions allowed.  All questions were answered. The patient understood, was satisfied, and agreed with the plan to proceed.    HPI: Thomas Mcdonald is a 64 y.o. male who presents for colonoscopy.  Medical history as below.  Tolerated the prep.  No recent chest pain or shortness of breath.  No abdominal pain today.  Past Medical History:  Diagnosis Date   Asthma    no inhaler    Past Surgical History:  Procedure Laterality Date   KNEE ARTHROSCOPY WITH MEDIAL MENISECTOMY Right 06/25/2018   Procedure: RIGHT KNEE ARTHROSCOPY WITH PARTIAL MEDIAL MENISCECTOMY;  Surgeon: Jerri Kay HERO, MD;  Location: [facility];  Service: Orthopedics;  Laterality: Right;   NO PAST SURGERIES     SYNOVECTOMY Right 06/25/2018   Procedure: SYNOVECTOMY;  Surgeon: Jerri Kay HERO, MD;  Location: [facility];  Service: Orthopedics;  Laterality: Right;    Prior to Admission medications  Medication Sig Start Date End Date Taking? Authorizing Provider  Multiple Vitamin (MULTIVITAMIN WITH MINERALS) TABS tablet Take 1 tablet by mouth daily.   Yes [provider]    Current Outpatient Medications  Medication Sig Dispense Refill   Multiple Vitamin (MULTIVITAMIN WITH MINERALS) TABS tablet Take 1 tablet by mouth daily.     Current Facility-Administered Medications  Medication Dose Route Frequency Provider Last Rate Last Admin   0.9 %  sodium chloride  infusion  500 mL Intravenous Once Tynika Luddy, Gordy HERO, MD        Allergies as of 05/13/2024 - Review Complete 05/13/2024  Allergen  Reaction Noted   Shellfish allergy Hives 06/16/2018    Family History  Problem Relation Age of Onset   Cancer Mother    Heart disease Mother    Heart disease Father    Colon cancer Neg Hx    Colon polyps Neg Hx    Esophageal cancer Neg Hx    Rectal cancer Neg Hx    Stomach cancer Neg Hx     Social History   Socioeconomic History   Marital status: Single    Spouse name: Not on file   Number of children: Not on file   Years of education: Not on file   Highest education level: Not on file  Occupational History   Not on file  Tobacco Use   Smoking status: Every Day    Current packs/day: 0.75    Average packs/day: 0.8 packs/day for 48.1 years (36.1 ttl pk-yrs)    Types: Cigarettes    Start date: 43   Smokeless tobacco: Never  Vaping Use   Vaping status: Never Used  Substance and Sexual Activity   Alcohol use: Yes    Alcohol/week: 21.0 standard drinks of alcohol    Types: 21 Cans of beer per week    Comment: wife describes drinking a moderate amt daily   Drug use: No    Comment: H/O drug abuse. Prefers minimal narcotics   Sexual activity: Yes    Partners: Female  Other Topics Concern   Not on file  Social History Narrative   Not on file  Social Drivers of Health   Tobacco Use: High Risk (05/13/2024)   Patient History    Smoking Tobacco Use: Every Day    Smokeless Tobacco Use: Never    Passive Exposure: Not on file  Financial Resource Strain: Low Risk  (10/03/2023)   Received from [facility]   Overall Financial Resource Strain (CARDIA)    Difficulty of Paying Living Expenses: Not hard at all  Food Insecurity: No Food Insecurity (10/03/2023)   Received from [facility]   Epic    Within the past 12 months, you worried that your food would run out before you got the money to buy more.: Never true    Within the past 12 months, the food you bought just didn't last and you didn't have money to get more.: Never true   Transportation Needs: No Transportation Needs (10/03/2023)   Received from [facility] - Transportation    In the past 12 months, has lack of transportation kept you from medical appointments or from getting medications?: No    Lack of Transportation (Non-Medical): No  Physical Activity: Not on file  Stress: Not on file  Social Connections: Not on file  Intimate Partner Violence: Not on file  Depression (PHQ2-9): Low Risk (02/13/2022)   Depression (PHQ2-9)    PHQ-2 Score: 1  Alcohol Screen: Not on file  Housing: Low Risk  (10/03/2023)   Received from [facility]   Epic    In the last 12 months, was there a time when you were not able to pay the mortgage or rent on time?: No    In the past 12 months, how many times have you moved where you were living?: 0    At any time in the past 12 months, were you homeless or living in a shelter (including now)?: No  Utilities: Not At Risk (10/03/2023)   Received from [facility]   Epic    In the past 12 months has the electric, gas, oil, or water company threatened to shut off services in your home?: No  Health Literacy: Not on file    Physical Exam: Vital signs in last 24 hours: @BP  127/72   Pulse 74   Temp 98.2 F (36.8 C) (Temporal)   Ht 5' 9 (1.753 m)   Wt 170 lb (77.1 kg)   SpO2 99%   BMI 25.10 kg/m  GEN: NAD EYE: Sclerae anicteric ENT: MMM CV: Non-tachycardic Pulm: CTA b/l GI: Soft, NT/ND NEURO:  Alert & Oriented x 3   Gordy Starch, MD [facility]  05/13/2024 8:09 AM  "

## 2024-05-13 NOTE — Patient Instructions (Signed)
 Handouts given: Polyps, Diverticulosis Resume previous diet. Continue present medications.  Await pathology results. Repeat colonoscopy is recommended for surveillance. The colonoscopy date will be determined after pathology results from today's exam become available for review.  YOU HAD AN ENDOSCOPIC PROCEDURE TODAY AT THE [facility]:   Refer to the procedure report that was given to you for any specific questions about what was found during the examination.  If the procedure report does not answer your questions, please call your gastroenterologist to clarify.  If you requested that your care partner not be given the details of your procedure findings, then the procedure report has been included in a sealed envelope for you to review at your convenience later.  YOU SHOULD EXPECT: Some feelings of bloating in the abdomen. Passage of more gas than usual.  Walking can help get rid of the air that was put into your GI tract during the procedure and reduce the bloating. If you had a lower endoscopy (such as a colonoscopy or flexible sigmoidoscopy) you may notice spotting of blood in your stool or on the toilet paper. If you underwent a bowel prep for your procedure, you may not have a normal bowel movement for a few days.  Please Note:  You might notice some irritation and congestion in your nose or some drainage.  This is from the oxygen used during your procedure.  There is no need for concern and it should clear up in a day or so.  SYMPTOMS TO REPORT IMMEDIATELY:  Following lower endoscopy (colonoscopy or flexible sigmoidoscopy):  Excessive amounts of blood in the stool  Significant tenderness or worsening of abdominal pains  Swelling of the abdomen that is new, acute  Fever of 100F or higher  For urgent or emergent issues, a gastroenterologist can be reached at any hour by calling [phone]. Do not use MyChart messaging for urgent concerns.    DIET:  We do recommend a  small meal at first, but then you may proceed to your regular diet.  Drink plenty of fluids but you should avoid alcoholic beverages for 24 hours.  ACTIVITY:  You should plan to take it easy for the rest of today and you should NOT DRIVE or use heavy machinery until tomorrow (because of the sedation medicines used during the test).    FOLLOW UP: Our staff will call the number listed on your records the next business day following your procedure.  We will call around 7:15- 8:00 am to check on you and address any questions or concerns that you may have regarding the information given to you following your procedure. If we do not reach you, we will leave a message.     If any biopsies were taken you will be contacted by phone or by letter within the next 1-3 weeks.  Please call us  at [phone] if you have not heard about the biopsies in 3 weeks.    SIGNATURES/CONFIDENTIALITY: You and/or your care partner have signed paperwork which will be entered into your electronic medical record.  These signatures attest to the fact that that the information above on your After Visit Summary has been reviewed and is understood.  Full responsibility of the confidentiality of this discharge information lies with you and/or your care-partner.

## 2024-05-13 NOTE — Progress Notes (Signed)
 Pt's states no medical or surgical changes since previsit or office visit.

## 2024-05-14 ENCOUNTER — Telehealth: Payer: Self-pay

## 2024-05-14 NOTE — Telephone Encounter (Signed)
" °  Follow up Call-     05/13/2024    7:22 AM  Call back number  Post procedure Call Back phone  # [phone]  Permission to leave phone message Yes     Patient questions:  Do you have a fever, pain , or abdominal swelling? No. Pain Score  0 *  Have you tolerated food without any problems? Yes.    Have you been able to return to your normal activities? Yes.    Do you have any questions about your discharge instructions: Diet   No. Medications  No. Follow up visit  No.  Do you have questions or concerns about your Care? No.  Actions: * If pain score is 4 or above: No action needed, pain <4.   "
